# Patient Record
Sex: Male | Born: 1970 | Race: White | Hispanic: No | Marital: Married | Smoking: Never smoker
Health system: Southern US, Community
[De-identification: ages and names within clinical notes are randomized; demographics above are authoritative.]

## PROBLEM LIST (undated history)

## (undated) DIAGNOSIS — I712 Thoracic aortic aneurysm, without rupture: Principal | ICD-10-CM

## (undated) DIAGNOSIS — K4020 Bilateral inguinal hernia, without obstruction or gangrene, not specified as recurrent: Secondary | ICD-10-CM

## (undated) DIAGNOSIS — Z8619 Personal history of other infectious and parasitic diseases: Secondary | ICD-10-CM

## (undated) DIAGNOSIS — Z87898 Personal history of other specified conditions: Secondary | ICD-10-CM

## (undated) DIAGNOSIS — G4733 Obstructive sleep apnea (adult) (pediatric): Secondary | ICD-10-CM

## (undated) HISTORY — DX: Thoracic aortic aneurysm, without rupture: I71.2

---

## 1994-01-20 HISTORY — PX: ANTERIOR CRUCIATE LIGAMENT REPAIR: SHX115

## 1998-11-18 ENCOUNTER — Emergency Department (HOSPITAL_COMMUNITY): Admission: EM | Admit: 1998-11-18 | Discharge: 1998-11-18 | Payer: Self-pay | Admitting: Emergency Medicine

## 2004-11-21 ENCOUNTER — Encounter: Admission: RE | Admit: 2004-11-21 | Discharge: 2004-11-21 | Payer: Self-pay | Admitting: Family Medicine

## 2012-02-21 DIAGNOSIS — I7121 Aneurysm of the ascending aorta, without rupture: Secondary | ICD-10-CM

## 2012-02-21 DIAGNOSIS — I77810 Thoracic aortic ectasia: Secondary | ICD-10-CM | POA: Insufficient documentation

## 2012-02-21 DIAGNOSIS — I712 Thoracic aortic aneurysm, without rupture: Secondary | ICD-10-CM | POA: Insufficient documentation

## 2012-02-21 HISTORY — DX: Aneurysm of the ascending aorta, without rupture: I71.21

## 2012-02-21 HISTORY — DX: Thoracic aortic aneurysm, without rupture: I71.2

## 2012-03-06 ENCOUNTER — Encounter (HOSPITAL_COMMUNITY): Payer: Self-pay | Admitting: Emergency Medicine

## 2012-03-06 ENCOUNTER — Emergency Department (HOSPITAL_COMMUNITY): Payer: Self-pay

## 2012-03-06 ENCOUNTER — Observation Stay (HOSPITAL_COMMUNITY)
Admission: EM | Admit: 2012-03-06 | Discharge: 2012-03-07 | Disposition: A | Discharge status: Home / Self Care | Payer: Self-pay | Attending: Internal Medicine | Admitting: Internal Medicine

## 2012-03-06 DIAGNOSIS — R000 Tachycardia, unspecified: Secondary | ICD-10-CM

## 2012-03-06 DIAGNOSIS — J029 Acute pharyngitis, unspecified: Secondary | ICD-10-CM | POA: Insufficient documentation

## 2012-03-06 DIAGNOSIS — R0602 Shortness of breath: Secondary | ICD-10-CM | POA: Insufficient documentation

## 2012-03-06 DIAGNOSIS — R110 Nausea: Secondary | ICD-10-CM | POA: Insufficient documentation

## 2012-03-06 DIAGNOSIS — R6883 Chills (without fever): Secondary | ICD-10-CM | POA: Insufficient documentation

## 2012-03-06 DIAGNOSIS — R079 Chest pain, unspecified: Principal | ICD-10-CM

## 2012-03-06 DIAGNOSIS — Z8249 Family history of ischemic heart disease and other diseases of the circulatory system: Secondary | ICD-10-CM | POA: Insufficient documentation

## 2012-03-06 DIAGNOSIS — I77819 Aortic ectasia, unspecified site: Secondary | ICD-10-CM | POA: Insufficient documentation

## 2012-03-06 LAB — CBC WITH DIFFERENTIAL/PLATELET
Basophils Absolute: 0 10*3/uL (ref 0.0–0.1)
Basophils Relative: 0 % (ref 0–1)
Eosinophils Absolute: 0.1 10*3/uL (ref 0.0–0.7)
Eosinophils Relative: 1 % (ref 0–5)
HCT: 46.1 % (ref 39.0–52.0)
Hemoglobin: 16.9 g/dL (ref 13.0–17.0)
Lymphocytes Relative: 11 % — ABNORMAL LOW (ref 12–46)
Lymphs Abs: 1 10*3/uL (ref 0.7–4.0)
MCH: 31.3 pg (ref 26.0–34.0)
MCV: 85.4 fL (ref 78.0–100.0)
Monocytes Absolute: 0.5 10*3/uL (ref 0.1–1.0)
Monocytes Relative: 5 % (ref 3–12)
Neutro Abs: 7.8 10*3/uL — ABNORMAL HIGH (ref 1.7–7.7)
Neutrophils Relative %: 84 % — ABNORMAL HIGH (ref 43–77)
Platelets: 157 10*3/uL (ref 150–400)
RBC: 5.4 MIL/uL (ref 4.22–5.81)
RDW: 12.2 % (ref 11.5–15.5)

## 2012-03-06 LAB — COMPREHENSIVE METABOLIC PANEL
ALT: 28 U/L (ref 0–53)
AST: 22 U/L (ref 0–37)
Albumin: 4.5 g/dL (ref 3.5–5.2)
Alkaline Phosphatase: 69 U/L (ref 39–117)
CO2: 24 mEq/L (ref 19–32)
Calcium: 9 mg/dL (ref 8.4–10.5)
Creatinine, Ser: 0.92 mg/dL (ref 0.50–1.35)
GFR calc Af Amer: >90 mL/min (ref >90)
GFR calc non Af Amer: >90 mL/min (ref >90)
Glucose, Bld: 108 mg/dL — ABNORMAL HIGH (ref 70–99)
Potassium: 4.2 mEq/L (ref 3.5–5.1)
Sodium: 137 mEq/L (ref 135–145)
Total Protein: 7.4 g/dL (ref 6.0–8.3)

## 2012-03-06 LAB — HEMOGLOBIN A1C
Hgb A1c MFr Bld: 5.4 % (ref <5.7)
Mean Plasma Glucose: 108 mg/dL (ref <117)

## 2012-03-06 LAB — LIPID PANEL
Cholesterol: 179 mg/dL (ref 0–200)
HDL: 43 mg/dL (ref >39)
Total CHOL/HDL Ratio: 4.2 RATIO

## 2012-03-06 LAB — TROPONIN I: Troponin I: <0.3 ng/mL (ref <0.30)

## 2012-03-06 MED ORDER — MORPHINE SULFATE 4 MG/ML IJ SOLN
4.0000 mg | Freq: Once | INTRAMUSCULAR | Status: AC
  Administered 2012-03-06: 4 mg via INTRAVENOUS
  Filled 2012-03-06: qty 1

## 2012-03-06 MED ORDER — SODIUM CHLORIDE 0.9 % IV SOLN: INTRAVENOUS | Status: DC

## 2012-03-06 MED ORDER — SODIUM CHLORIDE 0.9 % IJ SOLN
3.0000 mL | Freq: Two times a day (BID) | INTRAMUSCULAR | Status: DC
  Administered 2012-03-06: 3 mL via INTRAVENOUS

## 2012-03-06 MED ORDER — SODIUM CHLORIDE 0.9 % IV SOLN: 250.0000 mL | INTRAVENOUS | Status: DC | PRN

## 2012-03-06 MED ORDER — SODIUM CHLORIDE 0.9 % IJ SOLN
3.0000 mL | Freq: Two times a day (BID) | INTRAMUSCULAR | Status: DC
  Administered 2012-03-06 – 2012-03-07 (×2): 3 mL via INTRAVENOUS

## 2012-03-06 MED ORDER — IOHEXOL 350 MG/ML SOLN
80.0000 mL | Freq: Once | INTRAVENOUS | Status: AC | PRN
  Administered 2012-03-06: 80 mL via INTRAVENOUS

## 2012-03-06 MED ORDER — ONDANSETRON HCL 4 MG/2ML IJ SOLN: 4.0000 mg | Freq: Four times a day (QID) | INTRAMUSCULAR | Status: DC | PRN

## 2012-03-06 MED ORDER — ENOXAPARIN SODIUM 40 MG/0.4ML ~~LOC~~ SOLN
40.0000 mg | SUBCUTANEOUS | Status: DC
  Administered 2012-03-06: 40 mg via SUBCUTANEOUS
  Filled 2012-03-06 (×3): qty 0.4

## 2012-03-06 MED ORDER — ONDANSETRON HCL 4 MG PO TABS
4.0000 mg | ORAL_TABLET | Freq: Four times a day (QID) | ORAL | Status: DC | PRN
  Administered 2012-03-06: 4 mg via ORAL
  Filled 2012-03-06: qty 1

## 2012-03-06 MED ORDER — SODIUM CHLORIDE 0.9 % IV BOLUS (SEPSIS)
1000.0000 mL | Freq: Once | INTRAVENOUS | Status: AC
  Administered 2012-03-06: 1000 mL via INTRAVENOUS

## 2012-03-06 MED ORDER — ASPIRIN EC 81 MG PO TBEC
81.0000 mg | DELAYED_RELEASE_TABLET | Freq: Every day | ORAL | Status: DC
  Administered 2012-03-06 – 2012-03-07 (×2): 81 mg via ORAL
  Filled 2012-03-06 (×2): qty 1

## 2012-03-06 MED ORDER — ONDANSETRON HCL 4 MG/2ML IJ SOLN: 4.0000 mg | Freq: Three times a day (TID) | INTRAMUSCULAR | Status: DC | PRN

## 2012-03-06 MED ORDER — ASPIRIN 81 MG PO CHEW
324.0000 mg | CHEWABLE_TABLET | Freq: Once | ORAL | Status: DC
  Filled 2012-03-06: qty 4

## 2012-03-06 MED ORDER — NITROGLYCERIN 0.4 MG SL SUBL: 0.4000 mg | SUBLINGUAL_TABLET | SUBLINGUAL | Status: DC | PRN

## 2012-03-06 MED ORDER — IOHEXOL 350 MG/ML SOLN
75.0000 mL | Freq: Once | INTRAVENOUS | Status: AC | PRN
  Administered 2012-03-06: 75 mL via INTRAVENOUS

## 2012-03-06 MED ORDER — ONDANSETRON HCL 4 MG/2ML IJ SOLN
4.0000 mg | Freq: Once | INTRAMUSCULAR | Status: AC
  Administered 2012-03-06: 4 mg via INTRAVENOUS
  Filled 2012-03-06: qty 2

## 2012-03-06 MED ORDER — MORPHINE SULFATE 2 MG/ML IJ SOLN: 2.0000 mg | INTRAMUSCULAR | Status: DC | PRN

## 2012-03-06 MED ORDER — SODIUM CHLORIDE 0.9 % IJ SOLN: 3.0000 mL | INTRAMUSCULAR | Status: DC | PRN

## 2012-03-06 MED ORDER — METOPROLOL TARTRATE 12.5 MG HALF TABLET
12.5000 mg | ORAL_TABLET | Freq: Two times a day (BID) | ORAL | Status: DC
  Administered 2012-03-06 – 2012-03-07 (×3): 12.5 mg via ORAL
  Filled 2012-03-06 (×5): qty 1

## 2012-03-06 NOTE — ED Notes (Signed)
Pt presents to Ed today with c/o of chest pain and shortness of breath and chills at 2 am this morning, sore throat yesterday.

## 2012-03-06 NOTE — ED Notes (Signed)
Patient transported to CT 

## 2012-03-06 NOTE — ED Provider Notes (Signed)
History     CSN: [id number]  Arrival date & time 03/06/12  0709   First MD Initiated Contact with Patient 03/06/12 0715      Chief Complaint  Patient presents with  . Chest Pain    (Consider location/radiation/quality/duration/timing/severity/associated sxs/prior treatment) HPI  History reviewed. No pertinent past medical history.  Past Surgical History  Procedure Laterality Date  . Knee surgery      History reviewed. No pertinent family history.  History  Substance Use Topics  . Smoking status: Not on file  . Smokeless tobacco: Not on file  . Alcohol Use: Not on file      Review of Systems  Allergies  Review of patient's allergies indicates no known allergies.  Home Medications  No current outpatient prescriptions on file.  BP 122/79  Pulse 104  Temp(Src) 98.2 F (36.8 C) (Oral)  Resp 19  Ht 6\' 1"  (1.854 m)  Wt 199 lb 15.3 oz (90.7 kg)  BMI 26.39 kg/m2  SpO2 96%  Physical Exam  ED Course  Procedures (including critical care time)  Labs Reviewed  CBC WITH DIFFERENTIAL - Abnormal; Notable for the following:    MCHC 36.7 (*)    Neutrophils Relative 84 (*)    Neutro Abs 7.8 (*)    Lymphocytes Relative 11 (*)    All other components within normal limits  COMPREHENSIVE METABOLIC PANEL - Abnormal; Notable for the following:    Glucose, Bld 108 (*)    All other components within normal limits  TROPONIN I  TSH  TROPONIN I  TROPONIN I  TROPONIN I  HEMOGLOBIN A1C  LIPID PANEL   Dg Chest 2 View  03/06/2012  *RADIOLOGY REPORT*  Clinical Data: Chest pain.  CHEST - 2 VIEW  Comparison: No priors.  Findings: Lung volumes are normal.  No consolidative airspace disease.  No pleural effusions.  No pneumothorax.  No pulmonary nodule or mass noted.  Pulmonary vasculature and the cardiomediastinal silhouette are within normal limits.  IMPRESSION: 1. No radiographic evidence of acute cardiopulmonary disease.   Original Report Authenticated By: Trudie Reed,  M.D.    Ct Angio Chest W/cm &/or Wo Cm  03/06/2012  *RADIOLOGY REPORT*  Clinical Data: Chest pain.  CT ANGIOGRAPHY CHEST  Technique:  Multidetector CT imaging of the chest using the standard protocol during bolus administration of intravenous contrast. Multiplanar reconstructed images including MIPs were obtained and reviewed to evaluate the vascular anatomy.  Contrast: 80mL OMNIPAQUE IOHEXOL 350 MG/ML SOLN, 75mL OMNIPAQUE IOHEXOL 350 MG/ML SOLN  Comparison: 03/06/2012  Findings: No filling defects in the pulmonary arteries to suggest pulmonary emboli.  Heart is upper limits normal in size.  Ascending aorta is slightly dilated measuring maximally 4.2 cm.  Descending thoracic aorta is normal caliber.  Small scattered mediastinal and bilateral axillary lymph nodes, none pathologically enlarged.  No adenopathy.  Lungs are clear.  No effusions. Visualized thyroid and chest wall soft tissues unremarkable. Imaging into the upper abdomen shows no acute findings.  And no acute bony abnormality.  IMPRESSION: No evidence of pulmonary embolus.  Slightly dilated ascending aorta, 4.2 cm proximally.   Original Report Authenticated By: Charlett Nose, M.D.      1. Chest pain   2. Tachycardia      Date: 03/06/2012  Rate: 111  Rhythm: sinus tachycardia  QRS Axis: normal  Intervals: normal  ST/T Wave abnormalities: normal  Conduction Disutrbances:none  Narrative Interpretation:   Old EKG Reviewed: none available    MDM  I personally performed  the services described in this documentation, which was scribed in my presence. The recorded information has been reviewed and is accurate.          [name], MD 03/06/12 1311

## 2012-03-06 NOTE — ED Notes (Signed)
Pt returned from CT; back on monitor; reports feeling better.

## 2012-03-06 NOTE — ED Notes (Signed)
PT reports he is feeling better since the morphine. Still c/o bilateral leg pain but reports it has improved.

## 2012-03-06 NOTE — H&P (Signed)
[facility] ADMISSION NOTE    Date: 03/06/2012               Patient Name:  Charles Skinner MRN: [id number]  DOB: [birth year] Age / Sex: 42 y.o., male   PCP: No primary provider on file.              Chief Complaint: chest pain and palpitations  History of Present Illness: Patient is a 43 y.o. male with no significant  PMHx , who presents to [facility] for evaluation of acute onset chest pain that began on the day of admission while the patient was asllep and woke him up at 2 AM. Pain is described as a dull chest pain located over left chest. The pain is rated as a 9/10 in severity at onset and currently rated 0/10. The pain is notably aggravated by nothing. It is alleviated by NSAIDs. has associated palpitations and shortness of breath. For the pain, the patient tried NSAIDs for pain, with minimal relief.  The patient has not experienced similar symptoms previously. Secondary to his constellation of symptoms, Mr. Charles Skinner presented to [facility] for further evaluation. Otherwise, the patient denies diaphoresis, nausea, near syncope and syncope.   Current Outpatient Medications:  (Not in a hospital admission)  Allergies: No Known Allergies   Past Medical History: History reviewed. No pertinent past medical history.  Past Surgical History: Past Surgical History  Procedure Laterality Date  . Knee surgery      Family History: History reviewed. No pertinent family history.  Social History: History   Social History  . Marital Status: Married    Spouse Name: N/A    Number of Children: N/A  . Years of Education: N/A   Occupational History  . Not on file.   Social History Main Topics  . Smoking status: Not on file  . Smokeless tobacco: Not on file  . Alcohol Use: Not on file  . Drug Use: Not on file  . Sexually Active: Not on file   Other Topics Concern  . Not on file   Social History Narrative  . No narrative on file    Review of Systems: Constitutional:  denies fever, ,  diaphoresis, appetite change and fatigue. Positive for chills  HEENT: denies photophobia, eye pain, redness, hearing loss, ear pain, congestion, , rhinorrhea, sneezing, neck pain, neck stiffness and tinnitus. Positive for sore throat  Respiratory: denies, DOE, cough, chest tightness, and wheezing. Admits to SOB  Cardiovascular: denies , palpitations and leg swelling. + chest pain  Gastrointestinal: admits to nausea, vomiting, . Denies abdominal pain, diarrhea, constipation, blood in stool.  Genitourinary: denies dysuria, urgency, frequency, hematuria, flank pain and difficulty urinating.  Musculoskeletal: denies  myalgias, back pain, joint swelling, arthralgias and gait problem.   Skin: denies pallor, rash and wound.  Neurological: denies dizziness, seizures, syncope, weakness, light-headedness, numbness and headaches.   Hematological: denies adenopathy, easy bruising, personal or family bleeding history.  Psychiatric/ Behavioral: denies suicidal ideation, mood changes, confusion, nervousness, sleep disturbance and agitation.    Vital Signs: Blood pressure 98/60, pulse 109, temperature 98.1 F (36.7 C), temperature source Oral, resp. rate 20, SpO2 97.00%.  Physical Exam: General: Vital signs reviewed and noted. Well-developed, well-nourished, in no acute distress; alert, appropriate and cooperative throughout examination.  Head: Normocephalic, atraumatic.  Eyes: PERRL, EOMI, No signs of anemia or jaundince.  Nose: Mucous membranes moist, not inflammed, nonerythematous.  Throat: Oropharynx nonerythematous, no exudate appreciated.   Neck: No deformities, masses, or tenderness  noted.Supple, No carotid Bruits, no JVD.  Lungs:  Normal respiratory effort. Clear to auscultation BL without crackles or wheezes.  Heart: Tachycardic. Regular rhythm. S1 and S2 normal without gallop, murmur, or rubs.  Abdomen:  BS normoactive. Soft, Nondistended, non-tender.  No masses or organomegaly.  Extremities:  No pretibial edema.  Neurologic: A&O X3, CN II - XII are grossly intact. Motor strength is 5/5 in the all 4 extremities, Sensations intact to light touch, Cerebellar signs negative.  Skin: No visible rashes, scars.    Lab results: Basic Metabolic Panel:  Recent Labs  45/40/98 0739  NA 137  K 4.2  CL 100  CO2 24  GLUCOSE 108*  BUN 22  CREATININE 0.92  CALCIUM 9.0   Liver Function Tests:  Recent Labs  03/06/12 0739  AST 22  ALT 28  ALKPHOS 69  BILITOT 0.6  PROT 7.4  ALBUMIN 4.5   CBC:  Recent Labs  03/06/12 0739  WBC 9.4  NEUTROABS 7.8*  HGB 16.9  HCT 46.1  MCV 85.4  PLT 157   Cardiac Enzymes:  Recent Labs  03/06/12 0740  TROPONINI <0.30       Imaging results:  Dg Chest 2 View  03/06/2012  *RADIOLOGY REPORT*  Clinical Data: Chest pain.  CHEST - 2 VIEW  Comparison: No priors.  Findings: Lung volumes are normal.  No consolidative airspace disease.  No pleural effusions.  No pneumothorax.  No pulmonary nodule or mass noted.  Pulmonary vasculature and the cardiomediastinal silhouette are within normal limits.  IMPRESSION: 1. No radiographic evidence of acute cardiopulmonary disease.   Original Report Authenticated By: Trudie Reed, M.D.    Ct Angio Chest W/cm &/or Wo Cm  03/06/2012  *RADIOLOGY REPORT*  Clinical Data: Chest pain.  CT ANGIOGRAPHY CHEST  Technique:  Multidetector CT imaging of the chest using the standard protocol during bolus administration of intravenous contrast. Multiplanar reconstructed images including MIPs were obtained and reviewed to evaluate the vascular anatomy.  Contrast: 80mL OMNIPAQUE IOHEXOL 350 MG/ML SOLN, 75mL OMNIPAQUE IOHEXOL 350 MG/ML SOLN  Comparison: 03/06/2012  Findings: No filling defects in the pulmonary arteries to suggest pulmonary emboli.  Heart is upper limits normal in size.  Ascending aorta is slightly dilated measuring maximally 4.2 cm.  Descending thoracic aorta is normal caliber.  Small scattered mediastinal and  bilateral axillary lymph nodes, none pathologically enlarged.  No adenopathy.  Lungs are clear.  No effusions. Visualized thyroid and chest wall soft tissues unremarkable. Imaging into the upper abdomen shows no acute findings.  And no acute bony abnormality.  IMPRESSION: No evidence of pulmonary embolus.  Slightly dilated ascending aorta, 4.2 cm proximally.   Original Report Authenticated By: Charlett Nose, M.D.       Other results:  EKG (03/06/2012) - sinus tachycardia, regular rate of approximately 120 bpm, normal axis, ST segments: normal.       Assessment & Plan: Pt is a 42 y.o. yo male with no significant PMHX of who presented to [facility] on 03/06/2012 with symptoms of left chest chest pain, with EKG showing sinus tachycardia with no acute ST- T wave changes and initial negative troponins. Therefore, interventions at this time will be focused on risk factor stratification.   Atypical Chest pain - patient does not have known history of coronary artery disease. He also has  No cardiac risk factors. Admission EKG showed sinus tachycardia , and cardiac enzymes have been negative so far.  We will also strict risk factor modification and management. CT angio  was done in ER which ruled out PE. Patient does have slightly dilated ascending aorta which will need future monitoring.   Admit to telemetry  Continue to cycle cardiac enzymes.  Continue aspirin  Check HbAIC and FLP for risk starification.  Continue morphine, oxygen, PRN nitroglycerin.  Start metoprolol 12.5 BID for tachycardia     DVT PPX - low molecular weight heparin  CODE STATUS - Full  COMMUNICATION: Patient and family at the bedside were updated on the plan of care.   DISPO - Disposition is deferred at this time, awaiting improvement of chest pain and tachycardia. Likely in 1-2 days     Signed: Lars Mage, MD  03/06/2012, 11:37 AM

## 2012-03-06 NOTE — ED Provider Notes (Signed)
History  This chart was scribed for Charles Racer, MD by Bennett Scrape, ED Scribe. This patient was seen in room A11C/A11C and the patient's care was started at 7:15 AM.  CSN: [id number]  Arrival date & time 03/06/12  0709   First MD Initiated Contact with Patient 03/06/12 0715      Chief Complaint  Patient presents with  . Chest Pain     Patient is a 42 y.o. male presenting with chest pain. The history is provided by the patient. No language interpreter was used.  Chest Pain Pain location:  L chest Pain radiates to:  Does not radiate Pain radiates to the back: no   Onset quality:  Sudden Duration:  5 hours Timing:  Constant Progression:  Improving Chronicity:  New Relieved by:  None tried Ineffective treatments:  None tried Associated symptoms: nausea and shortness of breath   Associated symptoms: no abdominal pain, no back pain, no cough, no diaphoresis, no fever and not vomiting   Risk factors: no prior DVT/PE and no surgery     Charles Skinner is a 42 y.o. male who presents to the Emergency Department complaining of 5 hours of sudden onset, gradually improving, constant CP with associated nausea, chills and SOB. He states that his pain and SOB are mild currently. He denies having prior episodes of similar symptoms. He denies any recent cough, travel, surgery. He denies having any sick contacts. He denies having a h/o DVTs. He reports a h/o MI in his family (Mother at age 41). He reports a mild sore throat yesterday denies back pain, rhinorrhea, sore throat, abdominal pain and fevers as associated symptoms.    History reviewed. No pertinent past medical history.  Past Surgical History  Procedure Laterality Date  . Knee surgery      History reviewed. No pertinent family history.  History  Substance Use Topics  . Smoking status: Not on file  . Smokeless tobacco: Not on file  . Alcohol Use: Not on file      Review of Systems  Constitutional: Positive for chills.  Negative for fever and diaphoresis.  HENT: Positive for sore throat. Negative for rhinorrhea.   Respiratory: Positive for shortness of breath. Negative for cough.   Cardiovascular: Positive for chest pain. Negative for leg swelling.  Gastrointestinal: Positive for nausea. Negative for vomiting and abdominal pain.  Musculoskeletal: Negative for back pain.  All other systems reviewed and are negative.    Allergies  Review of patient's allergies indicates no known allergies.  Home Medications   Current Outpatient Rx  Name  Route  Sig  Dispense  Refill  . ibuprofen (ADVIL,MOTRIN) 200 MG tablet   Oral   Take 200 mg by mouth every 6 (six) hours as needed for pain.           Triage Vitals: BP 122/84  Pulse 112  Temp(Src) 97.5 F (36.4 C) (Oral)  Resp 18  SpO2 96%  Physical Exam  Nursing note and vitals reviewed. Constitutional: He is oriented to person, place, and time. He appears well-developed and well-nourished. No distress.  HENT:  Head: Normocephalic and atraumatic.  Mouth/Throat: Oropharynx is clear and moist.  Eyes: Conjunctivae and EOM are normal. Pupils are equal, round, and reactive to light.  Neck: Normal range of motion. Neck supple. No tracheal deviation present.  Cardiovascular: Regular rhythm.  Tachycardia present.   No murmur heard. HR is 108  Pulmonary/Chest: Effort normal and breath sounds normal. No respiratory distress.  Abdominal: Soft. There  is no tenderness.  Musculoskeletal: Normal range of motion. He exhibits no edema.  Right calf appears to be larger than the left  Neurological: He is alert and oriented to person, place, and time.  Skin: Skin is warm and dry.  Psychiatric: He has a normal mood and affect. His behavior is normal.    ED Course  Procedures (including critical care time)  DIAGNOSTIC STUDIES: Oxygen Saturation is 96% on room air, adequate by my interpretation.    COORDINATION OF CARE: 7:30 AM-Discussed treatment plan which  includes ASA, CXR, CBC panel, and troponin with pt at bedside and pt agreed to plan.   7:45 AM-Ordered 324 mg ASA and 1,000 mL of bolus  8:30 AM- Ordered 4 mg morphine injection and 4 mg Zofran injection  10:42 AM-Pt rechecked and denies CP. Upon re-exam, pt is still tachycardic.  10:52 AM-Consult complete with Dr. Eben Burow, Hospitalist. Patient case explained and discussed. Dr. Eben Burow agrees to evaluate the pt in the ED for admission. Call ended at 10:53 AM.  Labs Reviewed  CBC WITH DIFFERENTIAL - Abnormal; Notable for the following:    MCHC 36.7 (*)    Neutrophils Relative 84 (*)    Neutro Abs 7.8 (*)    Lymphocytes Relative 11 (*)    All other components within normal limits  COMPREHENSIVE METABOLIC PANEL - Abnormal; Notable for the following:    Glucose, Bld 108 (*)    All other components within normal limits  TROPONIN I   Dg Chest 2 View  03/06/2012  *RADIOLOGY REPORT*  Clinical Data: Chest pain.  CHEST - 2 VIEW  Comparison: No priors.  Findings: Lung volumes are normal.  No consolidative airspace disease.  No pleural effusions.  No pneumothorax.  No pulmonary nodule or mass noted.  Pulmonary vasculature and the cardiomediastinal silhouette are within normal limits.  IMPRESSION: 1. No radiographic evidence of acute cardiopulmonary disease.   Original Report Authenticated By: Trudie Reed, M.D.    Ct Angio Chest W/cm &/or Wo Cm  03/06/2012  *RADIOLOGY REPORT*  Clinical Data: Chest pain.  CT ANGIOGRAPHY CHEST  Technique:  Multidetector CT imaging of the chest using the standard protocol during bolus administration of intravenous contrast. Multiplanar reconstructed images including MIPs were obtained and reviewed to evaluate the vascular anatomy.  Contrast: 80mL OMNIPAQUE IOHEXOL 350 MG/ML SOLN, 75mL OMNIPAQUE IOHEXOL 350 MG/ML SOLN  Comparison: 03/06/2012  Findings: No filling defects in the pulmonary arteries to suggest pulmonary emboli.  Heart is upper limits normal in size.  Ascending  aorta is slightly dilated measuring maximally 4.2 cm.  Descending thoracic aorta is normal caliber.  Small scattered mediastinal and bilateral axillary lymph nodes, none pathologically enlarged.  No adenopathy.  Lungs are clear.  No effusions. Visualized thyroid and chest wall soft tissues unremarkable. Imaging into the upper abdomen shows no acute findings.  And no acute bony abnormality.  IMPRESSION: No evidence of pulmonary embolus.  Slightly dilated ascending aorta, 4.2 cm proximally.   Original Report Authenticated By: Charlett Nose, M.D.      1. Chest pain   2. Tachycardia      Date: 03/06/2012  Rate: 111  Rhythm: sinus tachycardia  QRS Axis: normal  Intervals: normal  ST/T Wave abnormalities: normal  Conduction Disutrbances:none  Narrative Interpretation:   Old EKG Reviewed: none available    MDM   I personally performed the services described in this documentation, which was scribed in my presence. The recorded information has been reviewed and is accurate.  Charles Racer, MD 03/06/12 1056

## 2012-03-07 ENCOUNTER — Encounter (HOSPITAL_COMMUNITY): Payer: Self-pay | Admitting: *Deleted

## 2012-03-07 DIAGNOSIS — R079 Chest pain, unspecified: Principal | ICD-10-CM

## 2012-03-07 MED ORDER — INFLUENZA VIRUS VACC SPLIT PF IM SUSP: 0.5000 mL | INTRAMUSCULAR | Status: DC

## 2012-03-07 MED ORDER — METOPROLOL TARTRATE 12.5 MG HALF TABLET: 12.5000 mg | ORAL_TABLET | Freq: Two times a day (BID) | ORAL | Status: DC

## 2012-03-07 NOTE — Discharge Summary (Signed)
Physician Discharge Summary  Charles Skinner WUJ:[id number] DOB: 07-24-1970 DOA: 03/06/2012  PCP: No primary provider on file.  Admit date: 03/06/2012 Discharge date: 03/07/2012  Recommendations for Outpatient Follow-up:  1. Patient was d/c home . He has a PCP and states that he will schedule a follow up appointment himself in 1-2 weeks. 2. At his follow up appointment, we need to make sure that he has optimum BP control with dilated ascending aorta ( 4.2 cm) noted on his CT chest.   3. Make sure his tachycardia has resolved.   Follow-up Information   Follow up with Pcp Not In System. Schedule an appointment as soon as possible for a visit in 2 weeks. (Please follow up with your PCP in 2-4 weeks)    Contact information:   Please follow up with your PCP in 2-4 weeks     Discharge Diagnoses:  Chest pain Tachycardia Dilated Ascending Aorta to 4.2 cm proximally noted on CT chest   Discharge Condition: Stable. Disposition: Home  Consultants: None  Diet recommendation: Regular  Filed Weights   03/06/12 1200 03/07/12 0602  Weight: 199 lb 15.3 oz (90.7 kg) 174 lb 2.6 oz (79 kg)    History of present illness: Patient is a 42 y.o. male with no significant PMHx , who presents to [facility] for evaluation of acute onset chest pain that began on the day of admission while the patient was asllep and woke him up at 2 AM. Pain is described as a dull chest pain located over left chest. The pain is rated as a 9/10 in severity at onset and currently rated 0/10. The pain is notably aggravated by nothing. It is alleviated by NSAIDs. has associated palpitations and shortness of breath. For the pain, the patient tried NSAIDs for pain, with minimal relief.  The patient has not experienced similar symptoms previously. Secondary to his constellation of symptoms, [name] presented to [facility] for further evaluation. Otherwise, the patient denies diaphoresis, nausea, near syncope and syncope.    Hospital Course:  1.  Chest pain and tachycardia: Patient presented with sudden onset left sided chest pain that woke him  Up from sleep and tachycardia. He underwent CT- angio that was negative for PE. Incidentally he was found to have mildly dilated ascending thoracic aorta to 4.2 cm. He was started on metoprolol that helped with his tachycardia. Marland Kitchen His cardiac work up including Troponin's x3 were negative. EKG did not show any acute ST- T wave changes. His TSH, Hb AIC was WNL. His symptoms were attributed to mildly dilated ascending aorta. He was advised to schedule outpatient follow up with his PCP in 1-2 weeks. His chest pain continued to improve overnight- was chest pain free at d/c.    Discharge Instructions      Discharge Orders   Future Orders Complete By Expires     Call MD for:  As directed     Comments:      Please follow up with your PCP if you feel palpitations, chest pain, lightheadedness or dizziness.    Diet - low sodium heart healthy  As directed     Discharge instructions  As directed     Comments:      Patient had sinus tachycardia  And ascending aorta dilatation which needs to be followed as outpatient.    Increase activity slowly  As directed     Other Restrictions  As directed     Comments:      Please avoid  caffienated products. Please follow up with your PCP regularly.        Medication List    STOP taking these medications       ibuprofen 200 MG tablet  Commonly known as:  ADVIL,MOTRIN      TAKE these medications       metoprolol tartrate 12.5 mg Tabs  Commonly known as:  LOPRESSOR  Take 0.5 tablets (12.5 mg total) by mouth 2 (two) times daily.       Follow-up Information   Follow up with Pcp Not In System. Schedule an appointment as soon as possible for a visit in 2 weeks. (Please follow up with your PCP in 2-4 weeks)    Contact information:   Please follow up with your PCP in 2-4 weeks       The results of significant diagnostics from this hospitalization  (including imaging, microbiology, ancillary and laboratory) are listed below for reference.    Significant Diagnostic Studies: Dg Chest 2 View  03/06/2012  *RADIOLOGY REPORT*  Clinical Data: Chest pain.  CHEST - 2 VIEW  Comparison: No priors.  Findings: Lung volumes are normal.  No consolidative airspace disease.  No pleural effusions.  No pneumothorax.  No pulmonary nodule or mass noted.  Pulmonary vasculature and the cardiomediastinal silhouette are within normal limits.  IMPRESSION: 1. No radiographic evidence of acute cardiopulmonary disease.   Original Report Authenticated By: Trudie Reed, M.D.    Ct Angio Chest W/cm &/or Wo Cm  03/06/2012  *RADIOLOGY REPORT*  Clinical Data: Chest pain.  CT ANGIOGRAPHY CHEST  Technique:  Multidetector CT imaging of the chest using the standard protocol during bolus administration of intravenous contrast. Multiplanar reconstructed images including MIPs were obtained and reviewed to evaluate the vascular anatomy.  Contrast: 80mL OMNIPAQUE IOHEXOL 350 MG/ML SOLN, 75mL OMNIPAQUE IOHEXOL 350 MG/ML SOLN  Comparison: 03/06/2012  Findings: No filling defects in the pulmonary arteries to suggest pulmonary emboli.  Heart is upper limits normal in size.  Ascending aorta is slightly dilated measuring maximally 4.2 cm.  Descending thoracic aorta is normal caliber.  Small scattered mediastinal and bilateral axillary lymph nodes, none pathologically enlarged.  No adenopathy.  Lungs are clear.  No effusions. Visualized thyroid and chest wall soft tissues unremarkable. Imaging into the upper abdomen shows no acute findings.  And no acute bony abnormality.  IMPRESSION: No evidence of pulmonary embolus.  Slightly dilated ascending aorta, 4.2 cm proximally.   Original Report Authenticated By: Charlett Nose, M.D.     Microbiology: No results found for this or any previous visit (from the past 240 hour(s)).   Labs: Basic Metabolic Panel:  Recent Labs Lab 03/06/12 0739  NA 137   K 4.2  CL 100  CO2 24  GLUCOSE 108*  BUN 22  CREATININE 0.92  CALCIUM 9.0   Liver Function Tests:  Recent Labs Lab 03/06/12 0739  AST 22  ALT 28  ALKPHOS 69  BILITOT 0.6  PROT 7.4  ALBUMIN 4.5   CBC:  Recent Labs Lab 03/06/12 0739  WBC 9.4  NEUTROABS 7.8*  HGB 16.9  HCT 46.1  MCV 85.4  PLT 157   Cardiac Enzymes:  Recent Labs Lab 03/06/12 0740 03/06/12 1235 03/06/12 1811 03/07/12 0034  TROPONINI <0.30 <0.30 <0.30 <0.30    Active Problems: Chest pain Tachycardia Mildly dilated ascending aorta  Time coordinating discharge: 40 minutes  Signed:  Lars Mage, MD [facility] 03/07/2012, 11:58 AM

## 2012-03-07 NOTE — Progress Notes (Signed)
Utilization review completed.  

## 2012-08-14 ENCOUNTER — Emergency Department (HOSPITAL_COMMUNITY): Payer: Self-pay

## 2012-08-14 ENCOUNTER — Encounter (HOSPITAL_COMMUNITY): Payer: Self-pay | Admitting: Emergency Medicine

## 2012-08-14 ENCOUNTER — Emergency Department (HOSPITAL_COMMUNITY)
Admission: EM | Admit: 2012-08-14 | Discharge: 2012-08-14 | Disposition: A | Discharge status: Home / Self Care | Payer: Self-pay | Attending: Emergency Medicine | Admitting: Emergency Medicine

## 2012-08-14 DIAGNOSIS — Z79899 Other long term (current) drug therapy: Secondary | ICD-10-CM | POA: Insufficient documentation

## 2012-08-14 DIAGNOSIS — R1084 Generalized abdominal pain: Secondary | ICD-10-CM | POA: Insufficient documentation

## 2012-08-14 DIAGNOSIS — Z8679 Personal history of other diseases of the circulatory system: Secondary | ICD-10-CM | POA: Insufficient documentation

## 2012-08-14 DIAGNOSIS — R198 Other specified symptoms and signs involving the digestive system and abdomen: Secondary | ICD-10-CM | POA: Insufficient documentation

## 2012-08-14 LAB — COMPREHENSIVE METABOLIC PANEL
CO2: 29 mEq/L (ref 19–32)
Calcium: 9.5 mg/dL (ref 8.4–10.5)
Creatinine, Ser: 0.99 mg/dL (ref 0.50–1.35)
GFR calc Af Amer: >90 mL/min (ref >90)
GFR calc non Af Amer: >90 mL/min (ref >90)
Glucose, Bld: 95 mg/dL (ref 70–99)
Total Protein: 7 g/dL (ref 6.0–8.3)

## 2012-08-14 LAB — CBC WITH DIFFERENTIAL/PLATELET
Eosinophils Absolute: 0.1 10*3/uL (ref 0.0–0.7)
Eosinophils Relative: 1 % (ref 0–5)
HCT: 42.4 % (ref 39.0–52.0)
Lymphocytes Relative: 34 % (ref 12–46)
Lymphs Abs: 2.6 10*3/uL (ref 0.7–4.0)
MCH: 31.3 pg (ref 26.0–34.0)
MCV: 85.5 fL (ref 78.0–100.0)
Monocytes Absolute: 0.5 10*3/uL (ref 0.1–1.0)
RDW: 12.1 % (ref 11.5–15.5)
WBC: 7.5 10*3/uL (ref 4.0–10.5)

## 2012-08-14 MED ORDER — LACTINEX PO CHEW: 1.0000 | CHEWABLE_TABLET | Freq: Three times a day (TID) | ORAL | Status: DC

## 2012-08-14 NOTE — ED Provider Notes (Signed)
CSN: [id number]     Arrival date & time 08/14/12  1704 History     First MD Initiated Contact with Patient 08/14/12 1756     Chief Complaint  Patient presents with  . Abdominal Pain  . Constipation   (Consider location/radiation/quality/duration/timing/severity/associated sxs/prior Treatment) HPI  Charles Skinner is a(n) 42 y.o. male who presents with chief complaint of 2 weeks of constipation.  Patient states that 4 weeks ago he began having bowel irregularity.  He states that he was having small bowel movements that were irregular.  He states that this is very abnormal for his previous history as he usually has 3 bowel movements daily.  2 weeks ago he saw his primary care physician who did lab work and a rectal exam which were normal.  She asked him to begin taking Metamucil and increasing his BP vegetables, water intake.  Patient has been doing so.  He states he also had Dulcolax about 7 days ago and made a bowel movement which was a very large.  Days ago the patient drank a bottle and a half of magnesium citrate proceeded to have voluminous liquid stool.  The patient has not had a full pelvic bowel movements it and came to the emergency department for evaluation.  He states that he has a followup appointment with GI on August 6 but "could not wait that long."  He has some associated abdominal discomfort and mild nausea but he denies any mucus or blood in his stools he denies hard stool balls he denies hematochezia or melena.  He has no family history of gastric or colon cancer. Denies fevers, chills, myalgias, arthralgias. Denies DOE, SOB, chest tightness or pressure, radiation to left arm, jaw or back, or diaphoresis. Denies dysuria, flank pain, suprapubic pain, frequency, urgency, or hematuria. Denies headaches, light headedness, weakness, visual disturbances.    Past Medical History  Diagnosis Date  . Sinus tachycardia    Past Surgical History  Procedure Laterality Date  . Knee surgery      No family history on file. History  Substance Use Topics  . Smoking status: Never Smoker   . Smokeless tobacco: Never Used  . Alcohol Use: No    Review of Systems Ten systems reviewed and are negative for acute change, except as noted in the HPI.   Allergies  Review of patient's allergies indicates no known allergies.  Home Medications   Current Outpatient Rx  Name  Route  Sig  Dispense  Refill  . ALOE VERA JUICE PO   Oral   Take 1 Bottle by mouth daily as needed (constipation).         . bisacodyl (BISACODYL) 5 MG EC tablet   Oral   Take 5 mg by mouth daily as needed for constipation.         Marland Kitchen ibuprofen (ADVIL,MOTRIN) 200 MG tablet   Oral   Take 400 mg by mouth every 6 (six) hours as needed for pain.         . magnesium citrate SOLN   Oral   Take 1 Bottle by mouth as needed (constipation).         . metoprolol tartrate (LOPRESSOR) 12.5 mg TABS   Oral   Take 12.5 mg by mouth every evening.          . psyllium (METAMUCIL) 58.6 % powder   Oral   Take 1 packet by mouth 2 (two) times daily.         Marland Kitchen lactobacillus  acidophilus & bulgar (LACTINEX) chewable tablet   Oral   Chew 1 tablet by mouth 3 (three) times daily with meals.   90 tablet   0    BP 130/78  Pulse 91  Temp(Src) 98 F (36.7 C) (Oral)  Resp 18  Ht 6\' 1"  (1.854 m)  Wt 199 lb (90.266 kg)  BMI 26.26 kg/m2  SpO2 97% Physical Exam Physical Exam  Nursing note and vitals reviewed. Constitutional: He appears well-developed and well-nourished. No distress.  HENT:  Head: Normocephalic and atraumatic.  Eyes: Conjunctivae normal are normal. No scleral icterus.  Neck: Normal range of motion. Neck supple.  Cardiovascular: Normal rate, regular rhythm and normal heart sounds.   Pulmonary/Chest: Effort normal and breath sounds normal. No respiratory distress.  Abdominal: Soft. There is no tenderness.  Hyperactive bowel sounds and borborygmi.  No masses no distention.  Musculoskeletal: He  exhibits no edema.  Neurological: He is alert.  Skin: Skin is warm and dry. He is not diaphoretic.  Psychiatric: His behavior is normal.    ED Course   Procedures (including critical care time)  Labs Reviewed  CBC WITH DIFFERENTIAL - Abnormal; Notable for the following:    MCHC 36.6 (*)    All other components within normal limits  COMPREHENSIVE METABOLIC PANEL   Dg Abd Acute W/chest  08/14/2012   *RADIOLOGY REPORT*  Clinical Data: Right lower quadrant abdominal pain.  Nausea. Diarrhea.  ACUTE ABDOMEN SERIES (ABDOMEN 2 VIEW & CHEST 1 VIEW)  Comparison: Chest x-ray 03/06/2012.  Findings: Lung volumes are normal.  No consolidative airspace disease.  No pleural effusions.  No pneumothorax.  No pulmonary nodule or mass noted.  Pulmonary vasculature and the cardiomediastinal silhouette are within normal limits.  Gas and stool are seen scattered throughout the colon extending to the level of the distal rectum.  No pathologic distension of small bowel is noted.  No gross evidence of pneumoperitoneum.  IMPRESSION: 1.  Nonobstructive bowel gas pattern. 2.  No pneumoperitoneum. 3.  No radiographic evidence of acute cardiopulmonary disease.   Original Report Authenticated By: Trudie Reed, M.D.   1. Generalized abdominal discomfort   2. Irregular bowel habits     MDM  7:29 PM BP 130/78  Pulse 91  Temp(Src) 98 F (36.7 C) (Oral)  Resp 18  Ht 6\' 1"  (1.854 m)  Wt 199 lb (90.266 kg)  BMI 26.26 kg/m2  SpO2 97% Patient without lab abnormalities.  Acute abdominal series shows no obstructions.  Appears to have a large amount of gas in the colon.  Believe that the patient cleared the majority of his: Using the bottle one half of magnesium site.  2 days ago and has not had enough stool volume to create a normal bowel movement yet.  Discharging the patient with antibiotics.  I have suggested he discontinue using Metamucil but continue with insulin fibers in water such as green leafy vegetables plenty  of fruits. The patient followup with the GI specialist on August 6.  I have discussed reasons to seek immediate care at the emergency department.The patient appears reasonably screened and/or stabilized for discharge and I doubt any other medical condition or other [facility] requiring further screening, evaluation, or treatment in the ED at this time prior to discharge.   Arthor Captain, PA-C 08/14/12 1930

## 2012-08-14 NOTE — ED Notes (Signed)
Urine collected at bedside.

## 2012-08-14 NOTE — ED Notes (Signed)
Pt reports approximately 3 weeks of constipation. Pt has been seen by PMD for same at onset and was told possibly due to diet and not enough fluids. Pt reports very small BM today. Pt c/o abdominal pain intermittently.

## 2012-08-15 NOTE — ED Provider Notes (Signed)
Medical screening examination/treatment/procedure(s) were performed by non-physician practitioner and as supervising physician I was immediately available for consultation/collaboration.  Donnetta Hutching, MD 08/15/12 2103

## 2013-03-24 ENCOUNTER — Telehealth: Payer: Self-pay | Admitting: *Deleted

## 2013-04-20 ENCOUNTER — Ambulatory Visit: Payer: Self-pay | Admitting: Cardiology

## 2013-05-23 NOTE — Telephone Encounter (Signed)
Encounter Closed---05/23/13 TP 

## 2013-05-24 ENCOUNTER — Encounter: Payer: Self-pay | Admitting: *Deleted

## 2013-05-26 ENCOUNTER — Ambulatory Visit (INDEPENDENT_AMBULATORY_CARE_PROVIDER_SITE_OTHER): Payer: BC Managed Care – PPO | Admitting: Cardiology

## 2013-05-26 VITALS — BP 106/82 | HR 71 | Ht 73.0 in | Wt 201.9 lb

## 2013-05-26 DIAGNOSIS — R5383 Other fatigue: Secondary | ICD-10-CM

## 2013-05-26 DIAGNOSIS — R002 Palpitations: Secondary | ICD-10-CM

## 2013-05-26 DIAGNOSIS — I7121 Aneurysm of the ascending aorta, without rupture: Secondary | ICD-10-CM

## 2013-05-26 DIAGNOSIS — R5381 Other malaise: Secondary | ICD-10-CM

## 2013-05-26 DIAGNOSIS — I712 Thoracic aortic aneurysm, without rupture, unspecified: Secondary | ICD-10-CM

## 2013-05-26 NOTE — Patient Instructions (Addendum)
Will schedule a CT -CHEST AORTOGRAM in Feb/MARCH 2016 ( ORDER WILL BE PLACED IN LATE JAN 2016) OFFICE WILL CONTACT YOU.  Your physician wants you to follow-up in March 2016 after test. You will receive a reminder letter in the mail two months in advance. If you don't receive a letter, please call our office to schedule the follow-up appointment.

## 2013-05-28 ENCOUNTER — Encounter: Payer: Self-pay | Admitting: Cardiology

## 2013-05-28 DIAGNOSIS — R5383 Other fatigue: Secondary | ICD-10-CM | POA: Insufficient documentation

## 2013-05-28 DIAGNOSIS — R002 Palpitations: Secondary | ICD-10-CM | POA: Insufficient documentation

## 2013-05-28 NOTE — Assessment & Plan Note (Addendum)
Am not sure this is because he is working so hard, or if his underlying physiologic reason. Unlikely to be of cardiac etiology. Is going to be evaluated again by PCP.

## 2013-05-28 NOTE — Assessment & Plan Note (Signed)
Much better since he started limiting his caffeine intake. This may be one reason why he is tired, but I'm reluctant to switch the alternatives of beta blocker for palpitations for fear of worsening his fatigue.

## 2013-05-28 NOTE — Assessment & Plan Note (Addendum)
Is any worsening problems. We'll send a followup every couple years. We will recheck a CT scan next year to ensure that is not growing. Continue cardiac risk factor modification. He is not hypertensive, does not smoke, lipids are at goal otherwise for age.

## 2013-05-28 NOTE — Progress Notes (Signed)
PATIENT: Charles SprangJeff W Hebenstreit MRN: [id number]  DOB: Jan 18, 1971   DOV:05/28/2013 PCP: No PCP Per Patient  Clinic Note: Chief Complaint  Patient presents with  . Follow-up    yearly follow-up, pt c/o of tiredness, pt haven't taken Metoprolol since august of last year   HPI: Charles SprangJeff W Pola is a 43 y.o.  male with a PMH below who presents today for annual followup. I last saw him in April last year. He is now much a significant medical history besides an acute episode chest discomfort evaluated last year. This is thought to be most likely related to muscular skeletal discomfort. However a CT scan of the chest to rule out PE demonstrated a slightly dilated ascending aorta 4.2 cm. He never started Lopressor that was initially planned for palpitations.  Interval History: Today he comes mostly with a complaint of feeling just tired and fatigued all the time. He apparently was evaluated again for the discomfort he had been found to have H. pylori gastritis and was treated with the antibiotic course. He still has some trouble with his diet. But for most part from a cardiac standpoint really no exertional or resting chest pain. His palpitations are notably improved. His biggest concern is the fact he falls asleep very quickly and is very tired that courses day. No chest pain or shortness of breath with rest or exertion.  No PND, orthopnea or edema.  No lightheadedness, dizziness, weakness or syncope/near syncope.  Only rare palpitations since decreasing his caffeine intake No TIA/amaurosis fugax symptoms. No melena, hematochezia, hematuria, or epistaxis.  Past Medical History  Diagnosis Date  . Sinus tachycardia   . Ascending aortic aneurysm February 2014    4.2 cm by CT scan  . Intermittent palpitations   . Helicobacter pylori (H. pylori) infection      status post antibiotic and PPI treatment     Prior Cardiac Evaluation and Past Surgical History: Past Surgical History  Procedure Laterality Date  . Knee  surgery  1996    ACL - left knee    No Known Allergies  Current Outpatient Prescriptions  Medication Sig Dispense Refill  . Cholecalciferol (VITAMIN D) 2000 UNITS CAPS Take 1 capsule by mouth daily.      . Flaxseed, Linseed, (FLAXSEED OIL) 1000 MG CAPS Take 1 capsule by mouth daily.      . vitamin C (ASCORBIC ACID) 500 MG tablet Take 500 mg by mouth daily.      . psyllium (METAMUCIL) 58.6 % powder Take 1 packet by mouth 2 (two) times daily.       No current facility-administered medications for this visit.    History   Social History Narrative   Married father of 2. 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   ROS: A comprehensive Review of Systems - Negative except Noncardiac symptoms noted below General ROS: positive for  - Excessive sleepiness and fatigue Gastrointestinal ROS: positive for - appetite loss, gas/bloating and heartburn  PHYSICAL EXAM BP 106/82  Pulse 71  Ht 6\' 1"  (1.854 m)  Wt 201 lb 14.4 oz (91.581 kg)  BMI 26.64 kg/m2 General appearance: alert, cooperative, appears stated age, no distress and Well-nourished, well-groomed Neck: no adenopathy, no carotid bruit, no JVD and supple, symmetrical, trachea midline Lungs: clear to auscultation bilaterally, normal percussion bilaterally and Nonlabored, good air movement Heart: regular rate and rhythm, S1, S2 normal, no murmur, click, rub or gallop Abdomen: soft, non-tender;  bowel sounds normal; no masses,  no organomegaly Extremities: extremities normal, atraumatic, no cyanosis or edema Pulses: 2+ and symmetric Neurologic: Alert and oriented X 3, normal strength and tone. Normal symmetric reflexes. Normal coordination and gait   Adult ECG Report  Rate: 71 ;  Rhythm: normal sinus rhythm, normal axis and intervals  Narrative Interpretation: Normal EKG  Recent Labs: None since last year  ASSESSMENT / PLAN: Ascending aortic aneurysm - 4.2 cm Is any worsening  problems. We'll send a followup every couple years. We will recheck a CT scan next year to ensure that is not growing. Continue cardiac risk factor modification. He is not hypertensive, does not smoke, lipids are at goal otherwise for age.  Intermittent palpitations Much better since he started limiting his caffeine intake. This may be one reason why he is tired, but I'm reluctant to switch the alternatives of beta blocker for palpitations for fear of worsening his fatigue.  Fatigue Am not sure this is because he is working so hard, or if his underlying physiologic reason. Unlikely to be of cardiac etiology. Is going to be evaluated again by PCP.    Orders Placed This Encounter  Procedures  . EKG 12-Lead     Followup: One year  DAVID W. Herbie BaltimoreHARDING, M.D., M.S. Interventional Cardiology [facility]

## 2013-08-29 ENCOUNTER — Other Ambulatory Visit: Payer: Self-pay | Admitting: Gastroenterology

## 2013-08-29 DIAGNOSIS — R112 Nausea with vomiting, unspecified: Secondary | ICD-10-CM

## 2013-09-05 ENCOUNTER — Ambulatory Visit
Admission: RE | Admit: 2013-09-05 | Discharge: 2013-09-05 | Disposition: A | Discharge status: Home / Self Care | Payer: BC Managed Care – PPO | Source: Ambulatory Visit | Attending: Gastroenterology | Admitting: Gastroenterology

## 2013-09-05 ENCOUNTER — Encounter (INDEPENDENT_AMBULATORY_CARE_PROVIDER_SITE_OTHER): Payer: Self-pay

## 2013-09-05 DIAGNOSIS — R112 Nausea with vomiting, unspecified: Secondary | ICD-10-CM

## 2014-03-01 ENCOUNTER — Telehealth: Payer: Self-pay | Admitting: *Deleted

## 2014-03-01 DIAGNOSIS — Z01812 Encounter for preprocedural laboratory examination: Secondary | ICD-10-CM

## 2014-03-01 DIAGNOSIS — I7121 Aneurysm of the ascending aorta, without rupture: Secondary | ICD-10-CM

## 2014-03-01 DIAGNOSIS — I712 Thoracic aortic aneurysm, without rupture: Secondary | ICD-10-CM

## 2014-03-01 NOTE — Telephone Encounter (Signed)
PATIENT RETURN CALL INFORMED PATIENT OF CT SCAN TO BE ORDERED. HE IS IN AGREEMENT WOULD PREFER IT TO BE SCHEDULE ON A Thursday or Friday Will need bun creat. Order - aware to have it done 3 days prior

## 2014-03-01 NOTE — Telephone Encounter (Signed)
LEFT MESSAGE TO CALL BACK DISCUSS ORDERING CT ANGIO CHEST FOR FOLLOW UP OF PREVIOUS CT SCAN TO MONITOR ASCENDING AORTA  AREA  4.2 CM. WILL NEED LABS

## 2014-03-01 NOTE — Telephone Encounter (Signed)
-----   Message from Tobin ChadSharon V. Reighan Hipolito, RN sent at 05/26/2013  5:58 PM EDT ----- Schedule  In feb /march 2016 CT CHEST  AORTOGRAM -- F/U ASCENDING AORTIC ANEURYSM  CALL PATIENT

## 2014-03-09 ENCOUNTER — Ambulatory Visit
Admission: RE | Admit: 2014-03-09 | Discharge: 2014-03-09 | Disposition: A | Discharge status: Home / Self Care | Payer: BLUE CROSS/BLUE SHIELD | Source: Ambulatory Visit | Attending: Cardiology | Admitting: Cardiology

## 2014-03-09 DIAGNOSIS — I712 Thoracic aortic aneurysm, without rupture: Secondary | ICD-10-CM

## 2014-03-09 DIAGNOSIS — I7121 Aneurysm of the ascending aorta, without rupture: Secondary | ICD-10-CM

## 2014-03-09 MED ORDER — IOHEXOL 350 MG/ML SOLN
75.0000 mL | Freq: Once | INTRAVENOUS | Status: AC | PRN
  Administered 2014-03-09: 75 mL via INTRAVENOUS

## 2014-03-10 ENCOUNTER — Telehealth: Payer: Self-pay | Admitting: *Deleted

## 2014-03-10 NOTE — Telephone Encounter (Signed)
-----   Message from Marykay Lexavid W Harding, MD sent at 03/10/2014  2:55 PM EST ----- Regarding: CT A Result Accidentally closed the result window -   Stable TAA size from prior documentation => probably recheck in 1 yr.  Marykay LexHARDING, DAVID W, MD

## 2014-03-10 NOTE — Telephone Encounter (Signed)
Spoke to patient. Result given . Verbalized understanding annual office appointment schedule for 03/29/14

## 2014-03-29 ENCOUNTER — Ambulatory Visit (INDEPENDENT_AMBULATORY_CARE_PROVIDER_SITE_OTHER): Payer: BLUE CROSS/BLUE SHIELD | Admitting: Cardiology

## 2014-03-29 ENCOUNTER — Encounter: Payer: Self-pay | Admitting: Cardiology

## 2014-03-29 VITALS — BP 110/80 | HR 84 | Ht 73.0 in | Wt 207.9 lb

## 2014-03-29 DIAGNOSIS — I712 Thoracic aortic aneurysm, without rupture: Secondary | ICD-10-CM

## 2014-03-29 DIAGNOSIS — R002 Palpitations: Secondary | ICD-10-CM

## 2014-03-29 DIAGNOSIS — I7121 Aneurysm of the ascending aorta, without rupture: Secondary | ICD-10-CM

## 2014-03-29 NOTE — Patient Instructions (Addendum)
Recheck Ascending  Aorta aneurysm next  FEB 2017  Your physician wants you to follow-up in 12 month Dr Herbie BaltimoreHARDING after CT SCAN  You will receive a reminder letter in the mail two months in advance. If you don't receive a letter, please call our office to schedule the follow-up appointment.

## 2014-03-31 ENCOUNTER — Encounter: Payer: Self-pay | Admitting: Cardiology

## 2014-03-31 NOTE — Assessment & Plan Note (Signed)
No longer an issue. Continue to avoid XS caffeine.

## 2014-03-31 NOTE — Assessment & Plan Note (Signed)
Stable ascending TAA - will follow one more annual evaluation -- if stable, the would only follow every 2-3 years. Maintain stable BP control -- he is well within normal limits; Lipids were at goal - continue to monitor.

## 2014-03-31 NOTE — Progress Notes (Signed)
PATIENT: Charles Skinner MRN: [id number]  DOB: Mar 26, 1970   DOV:03/31/2014 PCP: Farris Has, MD  Clinic Note: Chief Complaint  Patient presents with  . Annual Exam    RESULT OF CT SCAN , LAST VISIT OF2014, NO CHEST PAIN , NO SOB , NOEDEMA   HPI: Charles Skinner is a 44 y.o.  male with a PMH below who presents today for annual followup. I last saw him in May of 2015.  He does not have much a significant medical history besides an acute episode chest discomfort evaluated with a PE protocol CTA that demonstrated a slightly dilated ascending aorta 4.2 cm. He never started Lopressor that was initially planned for palpitations & has not had any further palpitations.  Interval History:  He is her e today simply to f/u his annual CT scan that showed stable ~4.1 cm diameter ascending aorta. He is completely asymptomatic from a  CV standpoint:   No chest pain or shortness of breath with rest or exertion.   No PND, orthopnea or edema.   No lightheadedness, dizziness, weakness or syncope/near syncope.   Miniaml palpitations since decreasing his caffeine intake  No TIA/amaurosis fugax symptoms.  No melena, hematochezia, hematuria, or epistaxis.  Past Medical History  Diagnosis Date  . Sinus tachycardia   . Ascending aortic aneurysm February 2014    4.2 cm by CT scan  . Intermittent palpitations   . Helicobacter pylori (H. pylori) infection      status post antibiotic and PPI treatment    CTA 03/09/2014: Stable aneurysmal disease of Thoracic Aorta (TA):   3.8 cm @ level of sinuses of Valsalva  4.1 cm in Ascending TA = greatest diameter  Proximal arch 2.1 & distal arch 2.1  Descending TA: 2.0 cm  No dissection & normal branch patency  Prior Cardiac Evaluation and Past Surgical History: Past Surgical History  Procedure Laterality Date  . Knee surgery  1996    ACL - left knee    No Known Allergies  Current Outpatient Prescriptions  Medication Sig Dispense Refill  . Cholecalciferol  (VITAMIN D) 2000 UNITS CAPS Take 1 capsule by mouth daily.    . Flaxseed, Linseed, (FLAXSEED OIL) 1000 MG CAPS Take 1 capsule by mouth daily.    . vitamin C (ASCORBIC ACID) 500 MG tablet Take 500 mg by mouth daily.     No current facility-administered medications for this visit.    History   Social History Narrative   Married father of 2. 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   ROS: A comprehensive Review of Systems - and was negative   PHYSICAL EXAM BP 110/80 mmHg  Pulse 84  Ht  (1.854 m)  Wt 207 lb 14.4 oz (94.303 kg)  BMI 27.44 kg/m2 General appearance: alert, cooperative, appears stated age, no distress and Well-nourished, well-groomed Neck: no adenopathy, no carotid bruit, no JVD and supple, symmetrical, trachea midline Lungs: clear to auscultation bilaterally, normal percussion bilaterally and Nonlabored, good air movement Heart: regular rate and rhythm, S1, S2 normal, no murmur, click, rub or gallop Abdomen: soft, non-tender; bowel sounds normal; no masses,  no organomegaly Extremities: extremities normal, atraumatic, no cyanosis or edema Pulses: 2+ and symmetric Neurologic: Alert and oriented X 3, normal strength and tone. Normal symmetric reflexes. Normal coordination and gait   Adult ECG Report  Rate: 71 ;  Rhythm: normal sinus rhythm, normal axis and intervals  Narrative Interpretation:  Normal EKG  Recent Labs: None since last year  ASSESSMENT / PLAN: Ascending aortic aneurysm - 4.2 cm Stable ascending TAA - will follow one more annual evaluation -- if stable, the would only follow every 2-3 years. Maintain stable BP control -- he is well within normal limits; Lipids were at goal - continue to monitor.   Intermittent palpitations No longer an issue. Continue to avoid XS caffeine.     Orders Placed This Encounter  Procedures  . EKG 12-Lead    Followup: One year  DAVID W. Herbie BaltimoreHARDING, M.D.,  M.S. Interventional Cardiology [facility]

## 2015-03-14 ENCOUNTER — Telehealth: Payer: Self-pay | Admitting: *Deleted

## 2015-03-14 DIAGNOSIS — I712 Thoracic aortic aneurysm, without rupture: Secondary | ICD-10-CM

## 2015-03-14 DIAGNOSIS — Z01818 Encounter for other preprocedural examination: Secondary | ICD-10-CM

## 2015-03-14 DIAGNOSIS — I7121 Aneurysm of the ascending aorta, without rupture: Secondary | ICD-10-CM

## 2015-03-14 NOTE — Telephone Encounter (Signed)
Spoke with patient regarding CTA of chest and abd ordered by Dr. Herbie Baltimore.  Scheduled Friday 03/23/15 at 10:30 am--arrival time 10:10 am at [facility] [address]  Patient also instructed to get labs this week.  He voiced his understanding.

## 2015-03-14 NOTE — Telephone Encounter (Signed)
-----   Message from Tobin Chad, RN sent at 03/29/2014  4:42 PM EST ----- SCHEDULE CT AORTA ANEURYSM  AFTER 03/10/15 CALL PATIENT TO SCHEDULE  03/02/15

## 2015-03-14 NOTE — Telephone Encounter (Signed)
Order placed for annual  CT scan for aorta Labs BUN,CREATININE ORDER

## 2015-03-16 LAB — BUN: BUN: 16 mg/dL (ref 7–25)

## 2015-03-16 LAB — CREATININE, SERUM: Creat: 0.99 mg/dL (ref 0.60–1.35)

## 2015-03-23 ENCOUNTER — Ambulatory Visit
Admission: RE | Admit: 2015-03-23 | Discharge: 2015-03-23 | Disposition: A | Discharge status: Home / Self Care | Payer: BLUE CROSS/BLUE SHIELD | Source: Ambulatory Visit | Attending: Cardiology | Admitting: Cardiology

## 2015-03-23 DIAGNOSIS — I712 Thoracic aortic aneurysm, without rupture: Secondary | ICD-10-CM

## 2015-03-23 DIAGNOSIS — I7121 Aneurysm of the ascending aorta, without rupture: Secondary | ICD-10-CM

## 2015-03-23 MED ORDER — IOPAMIDOL (ISOVUE-370) INJECTION 76%
100.0000 mL | Freq: Once | INTRAVENOUS | Status: AC | PRN
  Administered 2015-03-23: 100 mL via INTRAVENOUS

## 2015-03-27 ENCOUNTER — Telehealth: Payer: Self-pay | Admitting: Cardiology

## 2015-03-27 NOTE — Telephone Encounter (Signed)
Goes straight to VM. Left msg for pt to call.

## 2015-03-27 NOTE — Telephone Encounter (Signed)
New message  ° ° ° °Pt is calling for lab results °

## 2015-03-28 NOTE — Telephone Encounter (Signed)
Follow Up ° °Pt returned call//  °

## 2015-03-28 NOTE — Telephone Encounter (Signed)
Results given, pt verbalized understanding. 

## 2015-04-24 ENCOUNTER — Encounter: Payer: Self-pay | Admitting: Cardiology

## 2015-04-24 ENCOUNTER — Ambulatory Visit (INDEPENDENT_AMBULATORY_CARE_PROVIDER_SITE_OTHER): Payer: BLUE CROSS/BLUE SHIELD | Admitting: Cardiology

## 2015-04-24 VITALS — BP 116/64 | HR 83 | Ht 73.0 in | Wt 201.0 lb

## 2015-04-24 DIAGNOSIS — I7121 Aneurysm of the ascending aorta, without rupture: Secondary | ICD-10-CM

## 2015-04-24 DIAGNOSIS — R002 Palpitations: Secondary | ICD-10-CM

## 2015-04-24 DIAGNOSIS — I712 Thoracic aortic aneurysm, without rupture: Secondary | ICD-10-CM | POA: Diagnosis not present

## 2015-04-24 NOTE — Patient Instructions (Signed)
NO CHANGE WITH CURRENT MEDICATIONS  WILL RECHECK CT SCAN IN 2 YEARS AN THE A FOLLOW UP APPOINTMENT WITH DR HARDING.

## 2015-04-24 NOTE — Progress Notes (Signed)
PCP: Farris HasMORROW, AARON, MD  Clinic Note: Chief Complaint  Patient presents with  . Annual Exam    Dilated ascending aorta    HPI: Charles Skinner is a 45 y.o. male with a PMH below who presents today for 1 year follow-up for CT scan diagnosis of dilated ascending aorta.  He had a CTA of the chest for PE back in 2014 that suggested a 4.2 cm dilated ascending aorta.  Repeat aortic CTA in 2016 showed a relatively stable ascending aorta at 4.1 cm. He just returns now after 1 year follow-up with stable 4.1 cm dilation. He does not have a murmur to suggest any bicuspid aortic valve or any other aortic valve and malaise. He has no Marfan's body habitus, but is just simply tall.  Charles Skinner was last seen in March 2016. Doing well at that time. No complaints at all.  Recent Hospitalizations: None  Studies Reviewed:  CTA Chest for aortic evaluation: Some motion artifact. Goal dilation of ascending aorta of 4.1 cm. Only 3.8 at sinus Valsalva. The proximal arch is normal at 3.1 and descending artery 2.3. Normal taper distally. Stable truncus anomaly noted  Interval History: Charles Skinner presents today he has no complaints. He is not had any complaints at all. No chest discomfort or tearing sensation. No resting or exertional dyspnea. No syncope/near syncope or TIAs amaurosis fugax. Denies any headaches or blurred vision. Overall very healthy with no major complaints.   ROS: A comprehensive was performed. Review of Systems  Constitutional: Negative for weight loss and malaise/fatigue.  HENT: Negative for nosebleeds.   Eyes: Negative for blurred vision and double vision.  Respiratory: Negative for cough, shortness of breath and wheezing.   Cardiovascular: Negative.        Per history of present illness  Gastrointestinal: Negative for blood in stool and melena.  Genitourinary: Negative for hematuria and flank pain.  Musculoskeletal: Negative for myalgias, joint pain and falls.  Neurological: Negative for  dizziness, sensory change, speech change, focal weakness, seizures, loss of consciousness and headaches.  Psychiatric/Behavioral: Negative for depression and memory loss. The patient is not nervous/anxious and does not have insomnia.   All other systems reviewed and are negative.   Past Medical History  Diagnosis Date  . Sinus tachycardia (HCC)   . Ascending aortic aneurysm (HCC) February 2014    4.2 cm by CT scan  . Intermittent palpitations   . Helicobacter pylori (H. pylori) infection      status post antibiotic and PPI treatment     Past Surgical History  Procedure Laterality Date  . Knee surgery  1996    ACL - left knee    Prior to Admission medications   Medication Sig Start Date End Date Taking? Authorizing Provider  Cholecalciferol (VITAMIN D) 2000 UNITS CAPS Take 1 capsule by mouth daily.   Yes Historical Provider, MD  Flaxseed, Linseed, (FLAXSEED OIL) 1000 MG CAPS Take 1 capsule by mouth daily.   Yes Historical Provider, MD  vitamin C (ASCORBIC ACID) 500 MG tablet Take 500 mg by mouth daily.   Yes Historical Provider, MD   No Known Allergies  Social History   Social History  . Marital Status: Married    Spouse Name: N/A  . Number of Children: 2  . Years of Education: 12   Occupational History  . tile installer    Social History Main Topics  . Smoking status: Never Smoker   . Smokeless tobacco: Never Used  . Alcohol Use:  No  . Drug Use: No  . Sexual Activity: Not Asked   Other Topics Concern  . 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   Family History  Problem Relation Age of Onset  . Heart attack Mother 46    Wt Readings from Last 3 Encounters:  04/24/15 201 lb (91.173 kg)  03/29/14 207 lb 14.4 oz (94.303 kg)  05/26/13 201 lb 14.4 oz (91.581 kg)    PHYSICAL EXAM BP 116/64 mmHg  Pulse 83  Ht  (1.854 m)  Wt 201 lb (91.173 kg)   BMI 26.52 kg/m2 General appearance: alert, cooperative, appears stated age, no distress and Well-nourished, well-groomed Neck: no adenopathy, no carotid bruit, no JVD and supple, symmetrical, trachea midline Lungs: clear to auscultation bilaterally, normal percussion bilaterally and Nonlabored, good air movement Heart: regular rate and rhythm, S1, S2 normal, no murmur, click, rub or gallop Abdomen: soft, non-tender; bowel sounds normal; no masses, no organomegaly Extremities: extremities normal, atraumatic, no cyanosis or edema Pulses: 2+ and symmetric Neurologic: Alert and oriented X 3, normal strength and tone. Normal symmetric reflexes. Normal coordination and gait    Adult ECG Report  Rate: 83 ;  Rhythm: normal sinus rhythm, premature ventricular contractions (PVC) and Normal axis, intervals and durations. Normal voltage with possible left atrial enlargement;   Narrative Interpretation: Stable EKG   Other studies Reviewed: Additional studies/ records that were reviewed today include:  Recent Labs:  None available   ASSESSMENT / PLAN: Problem List Items Addressed This Visit    RESOLVED: Intermittent palpitations (Chronic)   Relevant Orders   EKG 12-Lead (Completed)   CT ANGIO CHEST AORTA W/CM &/OR WO/CM   Ascending aortic aneurysm - 4.2 cm - Primary (Chronic)    Yet again stable findings on CT angiography. This is just ectatic dilation. Not a true aneurysm. At this point has been stable for 3 years, we can recheck in 2-3 years. May consider checking an echocardiogram if the "truncus abnormality"  is still noted.  Just doesn't have any abnormal exam findings to make me feel compelled to check an echo.  Plan is to maintain stable blood pressure control (which it is) his lipids at goal the past. As healthy as he is without significant obesity, he should be okay for a while. Will defer to PCP.      Relevant Orders   EKG 12-Lead (Completed)   CT ANGIO CHEST AORTA W/CM &/OR WO/CM     Palpitations are essentially all resolved.  Current medicines are reviewed at length with the patient today. (+/- concerns) None The following changes have been made: None Studies Ordered:   Orders Placed This Encounter  Procedures  . CT ANGIO CHEST AORTA W/CM &/OR WO/CM  . EKG 12-Lead   Follow-up in 2 years after scan   Marykay Lex, M.D., M.S. Interventional Cardiologist   Pager # [phone] Phone # [phone] [address] [place], [place] [address]

## 2015-04-26 ENCOUNTER — Encounter: Payer: Self-pay | Admitting: Cardiology

## 2015-04-26 NOTE — Assessment & Plan Note (Signed)
Yet again stable findings on CT angiography. This is just ectatic dilation. Not a true aneurysm. At this point has been stable for 3 years, we can recheck in 2-3 years. May consider checking an echocardiogram if the "truncus abnormality"  is still noted.  Just doesn't have any abnormal exam findings to make me feel compelled to check an echo.  Plan is to maintain stable blood pressure control (which it is) his lipids at goal the past. As healthy as he is without significant obesity, he should be okay for a while. Will defer to PCP.

## 2015-06-08 DIAGNOSIS — R1030 Lower abdominal pain, unspecified: Secondary | ICD-10-CM | POA: Diagnosis not present

## 2015-06-08 DIAGNOSIS — Z0000 Encounter for general adult medical examination without abnormal findings: Secondary | ICD-10-CM | POA: Diagnosis not present

## 2015-06-08 DIAGNOSIS — Z13220 Encounter for screening for lipoid disorders: Secondary | ICD-10-CM | POA: Diagnosis not present

## 2015-06-08 DIAGNOSIS — Z125 Encounter for screening for malignant neoplasm of prostate: Secondary | ICD-10-CM | POA: Diagnosis not present

## 2015-06-15 DIAGNOSIS — R109 Unspecified abdominal pain: Secondary | ICD-10-CM | POA: Diagnosis not present

## 2015-07-19 DIAGNOSIS — R1030 Lower abdominal pain, unspecified: Secondary | ICD-10-CM | POA: Diagnosis not present

## 2015-07-19 DIAGNOSIS — R109 Unspecified abdominal pain: Secondary | ICD-10-CM | POA: Diagnosis not present

## 2015-08-08 ENCOUNTER — Ambulatory Visit: Payer: Self-pay | Admitting: General Surgery

## 2015-08-08 DIAGNOSIS — K4020 Bilateral inguinal hernia, without obstruction or gangrene, not specified as recurrent: Secondary | ICD-10-CM | POA: Diagnosis not present

## 2015-08-21 DIAGNOSIS — R1030 Lower abdominal pain, unspecified: Secondary | ICD-10-CM | POA: Diagnosis not present

## 2015-10-15 ENCOUNTER — Encounter (HOSPITAL_BASED_OUTPATIENT_CLINIC_OR_DEPARTMENT_OTHER): Payer: Self-pay | Admitting: *Deleted

## 2015-10-15 NOTE — Progress Notes (Signed)
NPO AFTER MN.  ARRIVE AT 0700.  NEEDS HG.  WILL DO HIBICLENS SHOWER HS BEFORE AND AM DOS.

## 2015-10-17 ENCOUNTER — Ambulatory Visit (HOSPITAL_BASED_OUTPATIENT_CLINIC_OR_DEPARTMENT_OTHER): Payer: BLUE CROSS/BLUE SHIELD | Admitting: Anesthesiology

## 2015-10-17 ENCOUNTER — Encounter (HOSPITAL_BASED_OUTPATIENT_CLINIC_OR_DEPARTMENT_OTHER): Payer: Self-pay | Admitting: *Deleted

## 2015-10-17 ENCOUNTER — Encounter (HOSPITAL_BASED_OUTPATIENT_CLINIC_OR_DEPARTMENT_OTHER)
Admission: RE | Disposition: A | Discharge status: Home / Self Care | Payer: Self-pay | Source: Ambulatory Visit | Attending: General Surgery

## 2015-10-17 ENCOUNTER — Ambulatory Visit (HOSPITAL_BASED_OUTPATIENT_CLINIC_OR_DEPARTMENT_OTHER)
Admission: RE | Admit: 2015-10-17 | Discharge: 2015-10-17 | Disposition: A | Discharge status: Home / Self Care | Payer: BLUE CROSS/BLUE SHIELD | Source: Ambulatory Visit | Attending: General Surgery | Admitting: General Surgery

## 2015-10-17 DIAGNOSIS — G4730 Sleep apnea, unspecified: Secondary | ICD-10-CM | POA: Diagnosis not present

## 2015-10-17 DIAGNOSIS — K4020 Bilateral inguinal hernia, without obstruction or gangrene, not specified as recurrent: Secondary | ICD-10-CM | POA: Insufficient documentation

## 2015-10-17 DIAGNOSIS — Z79899 Other long term (current) drug therapy: Secondary | ICD-10-CM | POA: Insufficient documentation

## 2015-10-17 DIAGNOSIS — G4733 Obstructive sleep apnea (adult) (pediatric): Secondary | ICD-10-CM | POA: Insufficient documentation

## 2015-10-17 HISTORY — PX: INSERTION OF MESH: SHX5868

## 2015-10-17 HISTORY — PX: INGUINAL HERNIA REPAIR: SHX194

## 2015-10-17 HISTORY — DX: Bilateral inguinal hernia, without obstruction or gangrene, not specified as recurrent: K40.20

## 2015-10-17 HISTORY — DX: Personal history of other infectious and parasitic diseases: Z86.19

## 2015-10-17 HISTORY — DX: Obstructive sleep apnea (adult) (pediatric): G47.33

## 2015-10-17 HISTORY — DX: Personal history of other specified conditions: Z87.898

## 2015-10-17 LAB — POCT HEMOGLOBIN-HEMACUE: HEMOGLOBIN: 15.7 g/dL (ref 13.0–17.0)

## 2015-10-17 SURGERY — REPAIR, HERNIA, INGUINAL, BILATERAL, LAPAROSCOPIC
Anesthesia: General | Site: Groin

## 2015-10-17 MED ORDER — SUGAMMADEX SODIUM 200 MG/2ML IV SOLN
INTRAVENOUS | Status: AC
  Filled 2015-10-17: qty 2

## 2015-10-17 MED ORDER — ACETAMINOPHEN 500 MG PO TABS
1000.0000 mg | ORAL_TABLET | Freq: Once | ORAL | Status: DC
  Filled 2015-10-17: qty 2

## 2015-10-17 MED ORDER — IBUPROFEN 800 MG PO TABS: 800.0000 mg | ORAL_TABLET | Freq: Three times a day (TID) | ORAL | 0 refills | Status: DC | PRN

## 2015-10-17 MED ORDER — HYDROCODONE-ACETAMINOPHEN 5-325 MG PO TABS: 1.0000 | ORAL_TABLET | Freq: Four times a day (QID) | ORAL | 0 refills | Status: DC | PRN

## 2015-10-17 MED ORDER — ONDANSETRON HCL 4 MG/2ML IJ SOLN
INTRAMUSCULAR | Status: DC | PRN
  Administered 2015-10-17: 4 mg via INTRAVENOUS

## 2015-10-17 MED ORDER — FENTANYL CITRATE (PF) 100 MCG/2ML IJ SOLN
INTRAMUSCULAR | Status: DC | PRN
  Administered 2015-10-17 (×2): 50 ug via INTRAVENOUS
  Administered 2015-10-17: 100 ug via INTRAVENOUS

## 2015-10-17 MED ORDER — LACTATED RINGERS IV SOLN
INTRAVENOUS | Status: DC
  Administered 2015-10-17 (×3): via INTRAVENOUS
  Filled 2015-10-17: qty 1000

## 2015-10-17 MED ORDER — ACETAMINOPHEN 500 MG PO TABS
1000.0000 mg | ORAL_TABLET | ORAL | Status: AC
  Administered 2015-10-17: 1000 mg via ORAL
  Filled 2015-10-17: qty 2

## 2015-10-17 MED ORDER — CHLORHEXIDINE GLUCONATE CLOTH 2 % EX PADS
6.0000 | MEDICATED_PAD | Freq: Once | CUTANEOUS | Status: DC
  Filled 2015-10-17: qty 6

## 2015-10-17 MED ORDER — PROPOFOL 500 MG/50ML IV EMUL
INTRAVENOUS | Status: AC
  Filled 2015-10-17: qty 100

## 2015-10-17 MED ORDER — CEFAZOLIN SODIUM-DEXTROSE 2-4 GM/100ML-% IV SOLN
INTRAVENOUS | Status: AC
  Filled 2015-10-17: qty 100

## 2015-10-17 MED ORDER — PROPOFOL 10 MG/ML IV BOLUS
INTRAVENOUS | Status: DC | PRN
  Administered 2015-10-17: 200 mg via INTRAVENOUS

## 2015-10-17 MED ORDER — PROMETHAZINE HCL 25 MG/ML IJ SOLN
6.2500 mg | INTRAMUSCULAR | Status: DC | PRN
  Filled 2015-10-17: qty 1

## 2015-10-17 MED ORDER — DEXAMETHASONE SODIUM PHOSPHATE 4 MG/ML IJ SOLN
INTRAMUSCULAR | Status: DC | PRN
  Administered 2015-10-17: 10 mg via INTRAVENOUS

## 2015-10-17 MED ORDER — GABAPENTIN 300 MG PO CAPS
300.0000 mg | ORAL_CAPSULE | ORAL | Status: AC
  Administered 2015-10-17: 300 mg via ORAL
  Filled 2015-10-17 (×2): qty 1

## 2015-10-17 MED ORDER — FENTANYL CITRATE (PF) 100 MCG/2ML IJ SOLN
INTRAMUSCULAR | Status: AC
Start: 2015-10-17 — End: 2015-10-17
  Filled 2015-10-17: qty 4

## 2015-10-17 MED ORDER — FENTANYL CITRATE (PF) 100 MCG/2ML IJ SOLN
25.0000 ug | INTRAMUSCULAR | Status: DC | PRN
  Filled 2015-10-17: qty 1

## 2015-10-17 MED ORDER — BUPIVACAINE LIPOSOME 1.3 % IJ SUSP
INTRAMUSCULAR | Status: DC | PRN
  Administered 2015-10-17: 40 mL

## 2015-10-17 MED ORDER — CEFAZOLIN SODIUM-DEXTROSE 2-4 GM/100ML-% IV SOLN
2.0000 g | INTRAVENOUS | Status: AC
  Administered 2015-10-17: 2 g via INTRAVENOUS
  Filled 2015-10-17: qty 100

## 2015-10-17 MED ORDER — SODIUM CHLORIDE 0.9 % IR SOLN
Status: DC | PRN
  Administered 2015-10-17: 1000 mL

## 2015-10-17 MED ORDER — PHENYLEPHRINE 40 MCG/ML (10ML) SYRINGE FOR IV PUSH (FOR BLOOD PRESSURE SUPPORT)
PREFILLED_SYRINGE | INTRAVENOUS | Status: AC
  Filled 2015-10-17: qty 10

## 2015-10-17 MED ORDER — SUGAMMADEX SODIUM 200 MG/2ML IV SOLN
INTRAVENOUS | Status: DC | PRN
  Administered 2015-10-17: 400 mg via INTRAVENOUS

## 2015-10-17 MED ORDER — ROCURONIUM BROMIDE 10 MG/ML (PF) SYRINGE
PREFILLED_SYRINGE | INTRAVENOUS | Status: AC
  Filled 2015-10-17: qty 10

## 2015-10-17 MED ORDER — LIDOCAINE 2% (20 MG/ML) 5 ML SYRINGE
INTRAMUSCULAR | Status: DC | PRN
  Administered 2015-10-17: 40 mg via INTRAVENOUS

## 2015-10-17 MED ORDER — MIDAZOLAM HCL 2 MG/2ML IJ SOLN
INTRAMUSCULAR | Status: AC
  Filled 2015-10-17: qty 2

## 2015-10-17 MED ORDER — OXYCODONE HCL 5 MG PO TABS
5.0000 mg | ORAL_TABLET | Freq: Once | ORAL | Status: AC
  Administered 2015-10-17: 5 mg via ORAL
  Filled 2015-10-17: qty 1

## 2015-10-17 MED ORDER — ROCURONIUM BROMIDE 10 MG/ML (PF) SYRINGE
PREFILLED_SYRINGE | INTRAVENOUS | Status: DC | PRN
  Administered 2015-10-17 (×2): 10 mg via INTRAVENOUS
  Administered 2015-10-17: 55 mg via INTRAVENOUS

## 2015-10-17 MED ORDER — OXYCODONE HCL 5 MG PO TABS
ORAL_TABLET | ORAL | Status: AC
  Filled 2015-10-17: qty 1

## 2015-10-17 MED ORDER — EPHEDRINE 5 MG/ML INJ
INTRAVENOUS | Status: AC
  Filled 2015-10-17: qty 10

## 2015-10-17 MED ORDER — ARTIFICIAL TEARS OP OINT
TOPICAL_OINTMENT | OPHTHALMIC | Status: AC
  Filled 2015-10-17: qty 3.5

## 2015-10-17 MED ORDER — LIDOCAINE 2% (20 MG/ML) 5 ML SYRINGE
INTRAMUSCULAR | Status: AC
  Filled 2015-10-17: qty 5

## 2015-10-17 MED ORDER — LIDOCAINE 2% (20 MG/ML) 5 ML SYRINGE
INTRAMUSCULAR | Status: AC
  Filled 2015-10-17: qty 10

## 2015-10-17 MED ORDER — CELECOXIB 400 MG PO CAPS
400.0000 mg | ORAL_CAPSULE | ORAL | Status: AC
  Administered 2015-10-17: 400 mg via ORAL
  Filled 2015-10-17 (×2): qty 1

## 2015-10-17 MED ORDER — LIDOCAINE 2% (20 MG/ML) 5 ML SYRINGE
INTRAMUSCULAR | Status: AC
  Filled 2015-10-17: qty 15

## 2015-10-17 MED ORDER — ACETAMINOPHEN 500 MG PO TABS
ORAL_TABLET | ORAL | Status: AC
  Filled 2015-10-17: qty 2

## 2015-10-17 SURGICAL SUPPLY — 42 items
BLADE 11 SAFETY STRL DISP (BLADE) ×3 IMPLANT
BLADE CLIPPER SENSICLIP SURGIC (BLADE) ×3 IMPLANT
CABLE HIGH FREQUENCY MONO STRZ (ELECTRODE) ×3 IMPLANT
CHLORAPREP W/TINT 26ML (MISCELLANEOUS) ×3 IMPLANT
COVER BACK TABLE 60X90IN (DRAPES) ×3 IMPLANT
COVER MAYO STAND STRL (DRAPES) ×3 IMPLANT
DEVICE SECURE STRAP 25 ABSORB (INSTRUMENTS) ×2 IMPLANT
DISSECT BALLN SPACEMKR + OVL (BALLOONS) ×3
DISSECTOR BALLN SPACEMKR + OVL (BALLOONS) ×2 IMPLANT
DRAPE LAPAROSCOPIC ABDOMINAL (DRAPES) ×3 IMPLANT
DRAPE UTILITY XL STRL (DRAPES) ×3 IMPLANT
ELECT REM PT RETURN 9FT ADLT (ELECTROSURGICAL) ×3
ELECTRODE REM PT RTRN 9FT ADLT (ELECTROSURGICAL) ×2 IMPLANT
GLOVE BIOGEL PI IND STRL 7.0 (GLOVE) ×3 IMPLANT
GLOVE BIOGEL PI IND STRL 7.5 (GLOVE) IMPLANT
GLOVE BIOGEL PI INDICATOR 7.0 (GLOVE) ×2
GLOVE BIOGEL PI INDICATOR 7.5 (GLOVE) ×2
GLOVE SURG SS PI 7.0 STRL IVOR (GLOVE) ×4 IMPLANT
GOWN STRL REUS W/TWL LRG LVL3 (GOWN DISPOSABLE) ×5 IMPLANT
IV NS 1000ML (IV SOLUTION) ×3
IV NS 1000ML BAXH (IV SOLUTION) IMPLANT
LIQUID BAND (GAUZE/BANDAGES/DRESSINGS) ×3 IMPLANT
MESH 3DMAX LIGHT 4.1X6.2 LT LR (Mesh General) ×2 IMPLANT
MESH 3DMAX LIGHT 4.1X6.2 RT LR (Mesh General) ×1 IMPLANT
NDL HYPO 25X1 1.5 SAFETY (NEEDLE) ×2 IMPLANT
NEEDLE HYPO 25X1 1.5 SAFETY (NEEDLE) ×3 IMPLANT
PACK BASIN DAY SURGERY FS (CUSTOM PROCEDURE TRAY) ×3 IMPLANT
PADDING ION DISPOSABLE (MISCELLANEOUS) ×4 IMPLANT
SCISSORS LAP 5X35 DISP (ENDOMECHANICALS) IMPLANT
SET IRRIG TUBING LAPAROSCOPIC (IRRIGATION / IRRIGATOR) ×3 IMPLANT
SHEARS HARMONIC ACE PLUS 36CM (ENDOMECHANICALS) IMPLANT
SOLUTION ANTI FOG 6CC (MISCELLANEOUS) ×3 IMPLANT
SUT MNCRL AB 4-0 PS2 18 (SUTURE) ×3 IMPLANT
SUT VIC AB 2-0 SH 27 (SUTURE)
SUT VIC AB 2-0 SH 27X BRD (SUTURE) IMPLANT
SUT VICRYL 0 UR6 27IN ABS (SUTURE) ×3 IMPLANT
SUT VLOC BARB 180 ABS3/0GR12 (SUTURE)
SUTURE VLOC BRB 180 ABS3/0GR12 (SUTURE) IMPLANT
SYR CONTROL 10ML LL (SYRINGE) ×3 IMPLANT
TOWEL OR 17X24 6PK STRL BLUE (TOWEL DISPOSABLE) ×6 IMPLANT
TROCAR CANNULA W/PORT DUAL 5MM (MISCELLANEOUS) ×3 IMPLANT
TUBING INSUF HEATED (TUBING) ×3 IMPLANT

## 2015-10-17 NOTE — Op Note (Signed)
Preop diagnosis: bilateral inguinal hernia  Postop diagnosis: bilateral indirect inguinal hernia  Procedure: laparoscopic Bilateral inguinal hernia repair with mesh  Surgeon: Feliciana RossettiLuke Kinsinger, M.D.  Asst: none  Anesthesia: Gen.   Indications for procedure: Charles SprangJeff W Skinner is a 45 y.o. male with symptoms of pain and enlarging Bilateral inguinal hernia(s). After discussing risks, alternatives and benefits he decided on laparoscopic repair and was brought to day surgery for repair.  Description of procedure: The patient was brought into the operative suite, placed supine. Anesthesia was administered with endotracheal tube. Patient was strapped in place. The patient was prepped and draped in the usual sterile fashion.  Next quarter percent Marcaine was injected to the Left of the umbilicus, and a transverse 2 cm incision was made. Dissection was used to visualize the anterior rectus sheath. Anterior rectus sheath was sharply incised, next to the 10 mm balloon dissecting trocar was inserted without resistance. Laparoscope was inserted the balloon was inflated under direct visualization. Balloon portion of the trocar was removed laparoscope was inserted and pneumoperitoneum was applied. 0.25% Marcaine was used then used to anesthetize the pubic area, and one area 5 cm superior in the middle area. 1 cm incisions were made over each these areas and 5 mm trocar was inserted under direct visualization.  On initial visualization , the right dissection was poor, the left side showed a moderate sized indirect inguinal hernia. First, blunt dissection occurred to free the peritoneum away from the abdominal wall on the right side. Next we began our dissection identifying the ASIS laterally and then working back medially removing the filmy tissue and adhesions of the peritoneum to the abdominal wall. Hernia sac was completely dissected out of the canal. Vas deference and contents of the cord were safely dissected away of  the hernia sac.   Next, we began our dissection on the left side identifying the ASIS laterally and then working back medially removing the filmy tissue and adhesions of the peritoneum to the abdominal wall. Hernia sac was completely dissected out of the canal. Vas deference and contents of the cord were safely dissected away of the hernia sac.   A 3D max light weight mesh was inserted and tacked medially to the lacunar ligament on the left side. One additional tack was used in the lateral anterior space. The mesh was positioned flat and directly up against the direct and indirect areas. A 3D max light weight mesh was inserted and tacked medially to the lacunar ligament on the right side. The CO2 was evacuated while watching to ensure the mesh did not migrate. The remainder of the Exparel mix was infused along the line of the inguinal ligament deep to the fascia. The anterior rectus fascia was closed with 0 vicryl in interrupted sutures and all skin incisions were closed with 4-0 monocryl subcu stitch. The patient awoke from anesthesia and was brought to PACU in stable condition.  Findings: bilateral indirect inguinal hernia  Specimen: none  Blood loss: 50 ml  Local anesthesia: 40 ml 1:1 Exparel:Saline  Complications: none  Implant: large right 3dmax light mesh, large left 3dmax light mesh  Feliciana RossettiLuke Kinsinger, M.D. General, Bariatric, & Minimally Invasive Surgery [facility], [place] 10:12 AM 10/17/2015

## 2015-10-17 NOTE — Anesthesia Postprocedure Evaluation (Signed)
Anesthesia Post Note  Patient: Matilde SprangJeff W Nicotra  Procedure(s) Performed: Procedure(s) (LRB): LAPAROSCOPIC BILATERAL INGUINAL HERNIA REPAIR (N/A) INSERTION OF MESH (Bilateral)  Patient location during evaluation: PACU Anesthesia Type: General Level of consciousness: awake and alert Pain management: pain level controlled Vital Signs Assessment: post-procedure vital signs reviewed and stable Respiratory status: spontaneous breathing, nonlabored ventilation, respiratory function stable and patient connected to nasal cannula oxygen Cardiovascular status: blood pressure returned to baseline and stable Postop Assessment: no signs of nausea or vomiting Anesthetic complications: no    Last Vitals:  Vitals:   10/17/15 1045 10/17/15 1100  BP: 118/73 118/80  Pulse: 83 79  Resp: 16 18  Temp:      Last Pain:  Vitals:   10/17/15 1045  TempSrc:   PainSc: 1                  Rhone Ozaki J

## 2015-10-17 NOTE — H&P (Signed)
Charles SprangJeff W Skinner is an 45 y.o. male.   Chief Complaint: inguinal hernia HPI: 45 yo male with symptomatic left inguinal hernia. 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 He has had to manually the reduce the hernia occasionally. He denies nausea or vomiting.  Past Medical History:  Diagnosis Date  . Ascending aortic aneurysm [facility](HCC) dx February 2014 incidental finding on CT    per last CTA 03-23-2015 --  AAA  4.2cm   (monitored by dr harding (cardiologist)  . Bilateral inguinal hernia   . History of Helicobacter pylori infection    2014-- resolved w/ antibiotics  . History of palpitations    2014--  resolved  . Mild obstructive sleep apnea    PER PT NO CPAP RECOMMENDATION    Past Surgical History:  Procedure Laterality Date  . ANTERIOR CRUCIATE LIGAMENT REPAIR Left 1996    Family History  Problem Relation Age of Onset  . Heart attack Mother [id number]   Social History:  reports that he has never smoked. He has never used smokeless tobacco. He reports that he does not drink alcohol or use drugs.  Allergies: No Known Allergies  Medications Prior to Admission  Medication Sig Dispense Refill  . Cholecalciferol (VITAMIN D) 2000 UNITS CAPS Take 1 capsule by mouth daily.    . Flaxseed, Linseed, (FLAXSEED OIL) 1000 MG CAPS Take 1 capsule by mouth daily.    . Probiotic Product (PROBIOTIC DAILY) CAPS Take 1 capsule by mouth daily.    . vitamin C (ASCORBIC ACID) 500 MG tablet Take 500 mg by mouth daily.      No results found for this or any previous visit (from the past 48 hour(s)). No results found.  Review of Systems  Constitutional: Negative for chills and fever.  HENT: Negative for hearing loss.   Eyes: Negative for blurred vision and double vision.  Respiratory: Negative for cough and hemoptysis.   Cardiovascular: Negative for chest pain and palpitations.  Gastrointestinal: Negative for abdominal pain, nausea and vomiting.  Genitourinary: Negative for  dysuria and urgency.  Musculoskeletal: Negative for myalgias and neck pain.  Skin: Negative for itching and rash.  Neurological: Negative for dizziness, tingling and headaches.  Endo/Heme/Allergies: Does not bruise/bleed easily.  Psychiatric/Behavioral: Negative for depression and suicidal ideas.    Blood pressure 123/79, pulse 75, temperature 97.7 F (36.5 C), temperature source Oral, resp. rate 16, height 6\' 1"  (1.854 m), weight 91.6 kg (202 lb), SpO2 97 %. Physical Exam  Vitals reviewed. Constitutional: He is oriented to person, place, and time. He appears well-developed and well-nourished.  HENT:  Head: Normocephalic and atraumatic.  Eyes: Conjunctivae and EOM are normal. Pupils are equal, round, and reactive to light.  Neck: Normal range of motion. Neck supple.  Cardiovascular: Normal rate and regular rhythm.   Respiratory: Effort normal and breath sounds normal.  GI: Soft. Bowel sounds are normal. He exhibits no distension. There is no tenderness.  Moderate size left inguinal hernia, small right fascial defect  Musculoskeletal: Normal range of motion.  Neurological: He is alert and oriented to person, place, and time.  Skin: Skin is warm and dry.  Psychiatric: He has a normal mood and affect. His behavior is normal.     Assessment/Plan 45 yo male with bilateral inguinal hernias -lap bilateral inguinal hernia repair -plan outpatient procedure  Rodman PickleLuke Aaron Samaira Holzworth, MD 10/17/2015, 7:44 AM

## 2015-10-17 NOTE — Anesthesia Preprocedure Evaluation (Addendum)
Anesthesia Evaluation  Patient identified by MRN, date of birth, ID band Patient awake    Reviewed: Allergy & Precautions, NPO status , Patient's Chart, lab work & pertinent test results  Airway Mallampati: II  TM Distance: >3 FB Neck ROM: Full    Dental no notable dental hx. (+) Teeth Intact, Dental Advisory Given   Pulmonary sleep apnea ,    Pulmonary exam normal breath sounds clear to auscultation       Cardiovascular Exercise Tolerance: Good + Peripheral Vascular Disease  Normal cardiovascular exam Rhythm:Regular Rate:Normal  4.2 cm AAA   Neuro/Psych negative neurological ROS  negative psych ROS   GI/Hepatic negative GI ROS, Neg liver ROS,   Endo/Other  negative endocrine ROS  Renal/GU negative Renal ROS  negative genitourinary   Musculoskeletal negative musculoskeletal ROS (+)   Abdominal   Peds negative pediatric ROS (+)  Hematology negative hematology ROS (+)   Anesthesia Other Findings   Reproductive/Obstetrics negative OB ROS                            Anesthesia Physical Anesthesia Plan  ASA: II  Anesthesia Plan: General   Post-op Pain Management:    Induction: Intravenous  Airway Management Planned: Oral ETT  Additional Equipment:   Intra-op Plan:   Post-operative Plan: Extubation in OR  Informed Consent: I have reviewed the patients History and Physical, chart, labs and discussed the procedure including the risks, benefits and alternatives for the proposed anesthesia with the patient or authorized representative who has indicated his/her understanding and acceptance.   Dental advisory given  Plan Discussed with: CRNA and Anesthesiologist  Anesthesia Plan Comments:        Anesthesia Quick Evaluation

## 2015-10-17 NOTE — Anesthesia Procedure Notes (Signed)
Procedure Name: Intubation Date/Time: 10/17/2015 8:35 AM Performed by: Tyrone NineSAUVE, Charod Slawinski F Pre-anesthesia Checklist: Patient identified, Timeout performed, Emergency Drugs available, Suction available and Patient being monitored Patient Re-evaluated:Patient Re-evaluated prior to inductionOxygen Delivery Method: Circle system utilized Preoxygenation: Pre-oxygenation with 100% oxygen Intubation Type: IV induction Ventilation: Oral airway inserted - appropriate to patient size and Mask ventilation without difficulty Laryngoscope Size: Mac and 3 Grade View: Grade II Tube type: Oral Tube size: 8.0 mm Number of attempts: 1 Airway Equipment and Method: Stylet Placement Confirmation: ETT inserted through vocal cords under direct vision,  positive ETCO2 and breath sounds checked- equal and bilateral Secured at: 22 cm Tube secured with: Tape Dental Injury: Teeth and Oropharynx as per pre-operative assessment

## 2015-10-17 NOTE — Transfer of Care (Signed)
Immediate Anesthesia Transfer of Care Note  Patient: Charles Skinner  Procedure(s) Performed: Procedure(s): LAPAROSCOPIC BILATERAL INGUINAL HERNIA REPAIR (N/A) INSERTION OF MESH (Bilateral)  Patient Location: PACU  Anesthesia Type:General  Level of Consciousness: awake, alert , oriented and patient cooperative  Airway & Oxygen Therapy: Patient Spontanous Breathing and Patient connected to face mask oxygen  Post-op Assessment: Report given to RN and Post -op Vital signs reviewed and stable  Post vital signs: Reviewed and stable  Last Vitals:  Vitals:   10/17/15 0720  BP: 123/79  Pulse: 75  Resp: 16  Temp: 36.5 C    Last Pain:  Vitals:   10/17/15 0720  TempSrc: Oral      Patients Stated Pain Goal: 8 (10/17/15 0749)  Complications: No apparent anesthesia complications

## 2015-10-17 NOTE — Discharge Instructions (Signed)
Post Anesthesia Home Care Instructions  Activity: Get plenty of rest for the remainder of the day. A responsible adult should stay with you for 24 hours following the procedure.  For the next 24 hours, DO NOT: -Drive a car -Advertising copywriterperate machinery -Drink alcoholic beverages -Take any medication unless instructed by your physician -Make any legal decisions or sign important papers.  Meals: Start with liquid foods such as gelatin or soup. Progress to regular foods as tolerated. Avoid greasy, spicy, heavy foods. If nausea and/or vomiting occur, drink only clear liquids until the nausea and/or vomiting subsides. Call your physician if vomiting continues.  Special Instructions/Symptoms: Your throat may feel dry or sore from the anesthesia or the breathing tube placed in your throat during surgery. If this causes discomfort, gargle with warm salt water. The discomfort should disappear within 24 hours.  If you had a scopolamine patch placed behind your ear for the management of post- operative nausea and/or vomiting:  1. The medication in the patch is effective for 72 hours, after which it should be removed.  Wrap patch in a tissue and discard in the trash. Wash hands thoroughly with soap and water. 2. You may remove the patch earlier than 72 hours if you experience unpleasant side effects which may include dry mouth, dizziness or visual disturbances. 3. Avoid touching the patch. Wash your hands with soap and water after contact with the patch.    Post Anesthesia Home Care Instructions  Activity: Get plenty of rest for the remainder of the day. A responsible adult should stay with you for 24 hours following the procedure.  For the next 24 hours, DO NOT: -Drive a car -Advertising copywriterperate machinery -Drink alcoholic beverages -Take any medication unless instructed by your physician -Make any legal decisions or sign important papers.  Meals: Start with liquid foods such as gelatin or soup. Progress to  regular foods as tolerated. Avoid greasy, spicy, heavy foods. If nausea and/or vomiting occur, drink only clear liquids until the nausea and/or vomiting subsides. Call your physician if vomiting continues.  Special Instructions/Symptoms: Your throat may feel dry or sore from the anesthesia or the breathing tube placed in your throat during surgery. If this causes discomfort, gargle with warm salt water. The discomfort should disappear within 24 hours.  If you had a scopolamine patch placed behind your ear for the management of post- operative nausea and/or vomiting:  1. The medication in the patch is effective for 72 hours, after which it should be removed.  Wrap patch in a tissue and discard in the trash. Wash hands thoroughly with soap and water. 2. You may remove the patch earlier than 72 hours if you experience unpleasant side effects which may include dry mouth, dizziness or visual disturbances. 3. Avoid touching the patch. Wash your hands with soap and water after contact with the patch.   HOME CARE INSTRUCTIONS - LAPAROSCOPY  Wound Care: The bandaids or dressing which are placed over the skin openings may be removed the day after surgery. The incision should be kept clean and dry. The stitches do not need to be removed. Should the incision become sore, red, and swollen after the first week, check with your doctor.  Personal Hygiene: Shower the day after your procedure. Always wipe from front to back after elimination.   Activity: Do not drive or operate any equipment today. The effects of the anesthesia are still present and drowsiness may result. Rest today, not necessarily flat bed rest, just take it easy. You  may resume your normal activity in one to three days or as instructed by your physician.  Sexual Activity: You resume sexual activity as indicated by your physician_________. If your laparoscopy was for a sterilization ( tubes tied ), continue current method of birth control until  after your next period or ask for specific instructions from your doctor.  Diet: Eat a light diet as desired this evening. You may resume a regular diet tomorrow.  Return to Work: Two to three days or as indicated by your doctor.  Expectations After Surgery: Your surgery will cause vaginal drainage or spotting which may continue for 2-3 days. Mild abdominal discomfort or tenderness is not unusual and some shoulder pain may also be noted which can be relieved by lying flat in pain.  Call Your Doctor If these Occur:  Persistent or heavy bleeding at incision site       Redness or swelling around incision       Elevation of temperature greater than 100 degrees F  Call for follow-up appointment _____________.

## 2015-10-18 ENCOUNTER — Encounter (HOSPITAL_BASED_OUTPATIENT_CLINIC_OR_DEPARTMENT_OTHER): Payer: Self-pay | Admitting: General Surgery

## 2015-11-21 DIAGNOSIS — L089 Local infection of the skin and subcutaneous tissue, unspecified: Secondary | ICD-10-CM | POA: Diagnosis not present

## 2015-12-17 DIAGNOSIS — Z23 Encounter for immunization: Secondary | ICD-10-CM | POA: Diagnosis not present

## 2016-04-10 DIAGNOSIS — R109 Unspecified abdominal pain: Secondary | ICD-10-CM | POA: Diagnosis not present

## 2016-06-11 DIAGNOSIS — Z0000 Encounter for general adult medical examination without abnormal findings: Secondary | ICD-10-CM | POA: Diagnosis not present

## 2016-06-11 DIAGNOSIS — E785 Hyperlipidemia, unspecified: Secondary | ICD-10-CM | POA: Diagnosis not present

## 2016-06-11 DIAGNOSIS — Z125 Encounter for screening for malignant neoplasm of prostate: Secondary | ICD-10-CM | POA: Diagnosis not present

## 2016-08-07 DIAGNOSIS — H00019 Hordeolum externum unspecified eye, unspecified eyelid: Secondary | ICD-10-CM | POA: Diagnosis not present

## 2016-08-18 DIAGNOSIS — H00021 Hordeolum internum right upper eyelid: Secondary | ICD-10-CM | POA: Diagnosis not present

## 2016-09-12 DIAGNOSIS — H00021 Hordeolum internum right upper eyelid: Secondary | ICD-10-CM | POA: Diagnosis not present

## 2016-09-12 DIAGNOSIS — D3131 Benign neoplasm of right choroid: Secondary | ICD-10-CM | POA: Diagnosis not present

## 2016-12-16 DIAGNOSIS — D3131 Benign neoplasm of right choroid: Secondary | ICD-10-CM | POA: Diagnosis not present

## 2017-01-22 DIAGNOSIS — D3131 Benign neoplasm of right choroid: Secondary | ICD-10-CM | POA: Diagnosis not present

## 2017-02-25 ENCOUNTER — Telehealth: Payer: Self-pay | Admitting: *Deleted

## 2017-02-25 DIAGNOSIS — I712 Thoracic aortic aneurysm, without rupture: Secondary | ICD-10-CM

## 2017-02-25 DIAGNOSIS — I7121 Aneurysm of the ascending aorta, without rupture: Secondary | ICD-10-CM

## 2017-02-25 DIAGNOSIS — Z01818 Encounter for other preprocedural examination: Secondary | ICD-10-CM

## 2017-02-25 NOTE — Telephone Encounter (Signed)
Left message to call back -- it is time to set up 2  YEAR F/U OF CT SCAN  ASCENDING AORTIC ANEURYSM AFTER 03/23/17 AND BEFORE 3/27 19 OFFICE APPOINTMENT - PATIENT WILL NEED TO HAVE BMP DONE PRIOR TO CT SCAN.

## 2017-02-25 NOTE — Telephone Encounter (Addendum)
SPOKE TO PATIENT . PATIENT AWARE CT SCAN IS NEEDED Mailed labslip -bmp few days prior to scan and appointment reschedule for first appointment.

## 2017-03-27 DIAGNOSIS — I712 Thoracic aortic aneurysm, without rupture: Secondary | ICD-10-CM | POA: Diagnosis not present

## 2017-03-27 DIAGNOSIS — Z01818 Encounter for other preprocedural examination: Secondary | ICD-10-CM | POA: Diagnosis not present

## 2017-03-28 LAB — BASIC METABOLIC PANEL
BUN / CREAT RATIO: 16 (ref 9–20)
BUN: 16 mg/dL (ref 6–24)
CALCIUM: 9.4 mg/dL (ref 8.7–10.2)
CO2: 24 mmol/L (ref 20–29)
CREATININE: 0.98 mg/dL (ref 0.76–1.27)
Chloride: 103 mmol/L (ref 96–106)
GFR calc non Af Amer: 91 mL/min/{1.73_m2} (ref >59)
GFR, EST AFRICAN AMERICAN: 105 mL/min/{1.73_m2} (ref >59)
Glucose: 77 mg/dL (ref 65–99)
Potassium: 4.1 mmol/L (ref 3.5–5.2)
Sodium: 143 mmol/L (ref 134–144)

## 2017-03-31 ENCOUNTER — Ambulatory Visit (INDEPENDENT_AMBULATORY_CARE_PROVIDER_SITE_OTHER)
Admission: RE | Admit: 2017-03-31 | Discharge: 2017-03-31 | Disposition: A | Discharge status: Home / Self Care | Payer: BLUE CROSS/BLUE SHIELD | Source: Ambulatory Visit | Attending: Cardiology | Admitting: Cardiology

## 2017-03-31 DIAGNOSIS — I712 Thoracic aortic aneurysm, without rupture: Secondary | ICD-10-CM

## 2017-03-31 DIAGNOSIS — R002 Palpitations: Secondary | ICD-10-CM | POA: Diagnosis not present

## 2017-03-31 DIAGNOSIS — I7121 Aneurysm of the ascending aorta, without rupture: Secondary | ICD-10-CM

## 2017-03-31 MED ORDER — IOPAMIDOL (ISOVUE-370) INJECTION 76%
100.0000 mL | Freq: Once | INTRAVENOUS | Status: AC | PRN
  Administered 2017-03-31: 100 mL via INTRAVENOUS

## 2017-04-15 ENCOUNTER — Ambulatory Visit: Payer: BLUE CROSS/BLUE SHIELD | Admitting: Cardiology

## 2017-04-15 ENCOUNTER — Encounter: Payer: Self-pay | Admitting: Cardiology

## 2017-04-15 VITALS — BP 102/70 | Ht 73.0 in | Wt 208.6 lb

## 2017-04-15 DIAGNOSIS — E785 Hyperlipidemia, unspecified: Secondary | ICD-10-CM

## 2017-04-15 DIAGNOSIS — I712 Thoracic aortic aneurysm, without rupture, unspecified: Secondary | ICD-10-CM

## 2017-04-15 DIAGNOSIS — R599 Enlarged lymph nodes, unspecified: Secondary | ICD-10-CM

## 2017-04-15 DIAGNOSIS — Z01818 Encounter for other preprocedural examination: Secondary | ICD-10-CM

## 2017-04-15 DIAGNOSIS — I7121 Aneurysm of the ascending aorta, without rupture: Secondary | ICD-10-CM

## 2017-04-15 NOTE — Progress Notes (Signed)
PCP: Farris HasMorrow, Aaron, MD  Clinic Note: Chief Complaint  Patient presents with  . 2 year follow up    no chest pain , no swelling , no s.o.b  . Thoracic Aortic Aneurysm    stable - f/u post CTA    HPI: Charles Skinner is a 47 y.o. male with a PMH below who presents today for 2 yr f/u for Ascending Aortic Aneurysm. CTA of the chest for PE back in 2014 that suggested a 4.2 cm dilated ascending aorta. Repeat aortic CTA in 2016 & 2017 showed a relatively stable ascending aorta at 4.1 cm.  Charles Skinner was last seen in April of 2017 for 1 yr f/u & now presents for 2 yr.    Recent Hospitalizations: n/a  Studies Personally Reviewed - (if available, images/films reviewed: From Epic Chart or Care Everywhere)  CTA Aortogram (Thoracic): Stable 4.1 cm ascending aortic aneurysm without complicating features. No significant atheromatous plaque, dissection or stenosis. Bovine variant brachiocephalic arterial origin anatomy without proximal stenosis. Proximal visualized abdominal aorta unremarkable.  New enlarged right paratracheal lymph node, possibly reactive but nonspecific. In the absence of a history of primary carcinoma or lymphoma, recommend attention on follow-up imaging as above.  Interval History: Charles Skinner returns here today for his 2-year follow-up feeling quite well.  He only notes occasionally having some times where he feels his heart rate gets up a little bit faster than usual.  May be on occasion it will run in the low 100s at resting heart rate, but otherwise he is totally asymptomatic from a cardiac standpoint.  No chest pain or shortness of breath with rest or exertion.  No PND, orthopnea or edema.  No palpitations, lightheadedness, dizziness, weakness or syncope/near syncope. No TIA/amaurosis fugax symptoms. No melena, hematochezia, hematuria, or epstaxis. No claudication.  ROS:  Review of Systems  Constitutional: Negative for fever and malaise/fatigue.  Respiratory: Negative for  cough, shortness of breath and wheezing.    I have reviewed and (if needed) personally updated the patient's problem list, medications, allergies, past medical and surgical history, social and family history.   Past Medical History:  Diagnosis Date  . Ascending aortic aneurysm (HCC) 02/2012   per last CTA 03-23-2015 --  TAA  4.1cm   (monitored by dr Herbie Baltimoreharding (cardiologist)  . Bilateral inguinal hernia   . History of Helicobacter pylori infection    2014-- resolved w/ antibiotics  . History of palpitations    2014--  resolved  . Mild obstructive sleep apnea    PER PT NO CPAP RECOMMENDATION    Past Surgical History:  Procedure Laterality Date  . ANTERIOR CRUCIATE LIGAMENT REPAIR Left 1996  . INGUINAL HERNIA REPAIR N/A 10/17/2015   Procedure: LAPAROSCOPIC BILATERAL INGUINAL HERNIA REPAIR;  Surgeon: De BlanchLuke Aaron Kinsinger, MD;  [facility];  Service: General;  Laterality: N/A;  . INSERTION OF MESH Bilateral 10/17/2015   Procedure: INSERTION OF MESH;  Surgeon: Rodman PickleLuke Aaron Kinsinger, MD;  [facility];  Service: General;  Laterality: Bilateral;    Current Meds  Medication Sig  . Cholecalciferol (VITAMIN D) 2000 UNITS CAPS Take 1 capsule by mouth daily as needed.   . Flaxseed, Linseed, (FLAXSEED OIL) 1000 MG CAPS Take 1 capsule by mouth daily as needed.   . vitamin C (ASCORBIC ACID) 500 MG tablet Take 500 mg by mouth daily as needed.     No Known Allergies  Social History   Tobacco Use  . Smoking status: Never  Smoker  . Smokeless tobacco: Never Used  Substance Use Topics  . Alcohol use: No  . Drug use: No   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    family history includes Heart attack (age of onset: 12) in his mother.  Wt Readings from Last 3 Encounters:  04/15/17 208 lb 9.6 oz (94.6 kg)  10/17/15 202 lb (91.6  kg)  04/24/15 201 lb (91.2 kg)    PHYSICAL EXAM BP 102/70   Ht 6\' 1"  (1.854 m)   Wt 208 lb 9.6 oz (94.6 kg)   BMI 27.52 kg/m  Physical Exam  Constitutional: He is oriented to person, place, and time. He appears well-developed and well-nourished. No distress.  HENT:  Head: Normocephalic and atraumatic.  Eyes: EOM are normal.  Neck: No hepatojugular reflux and no JVD present. Carotid bruit is not present.  Cardiovascular: Normal rate, regular rhythm, normal heart sounds and intact distal pulses.  No extrasystoles are present. PMI is not displaced. Exam reveals no gallop and no friction rub.  No murmur heard. Pulmonary/Chest: Effort normal and breath sounds normal. No respiratory distress. He has no wheezes.  Musculoskeletal: Normal range of motion. He exhibits no edema.  Neurological: He is alert and oriented to person, place, and time.  Psychiatric: He has a normal mood and affect. His behavior is normal. Judgment and thought content normal.  Nursing note and vitals reviewed.    Adult ECG Report  Rate: 76 ;  Rhythm: normal sinus rhythm and Normal axis, intervals and durations.;   Narrative Interpretation: Normal EKG   Other studies Reviewed: Additional studies/ records that were reviewed today include:  Recent Labs:    Lab Results  Component Value Date   CREATININE 0.98 03/27/2017   BUN 16 03/27/2017   NA 143 03/27/2017   K 4.1 03/27/2017   CL 103 03/27/2017   CO2 24 03/27/2017   Lab Results  Component Value Date   CHOL 171  06/11/2016   HDL 37 06/11/2016   LDLCALC 117 (H) 06/11/2016   TRIG 85 06/11/2016    ASSESSMENT / PLAN: Problem List Items Addressed This Visit    Thoracic aortic aneurysm without rupture (HCC) - ~4.1-4.2 cm - Primary (Chronic)    Once again stable dilated thoracic aorta at roughly 4.1 cm.  No comment on truncus abnormality at this time. There was no suggestion of a enlarged lymph node.  Therefore we will recheck another CT scan in a year.    With the size not change at that time, probably would wait 3 years.      Relevant Orders   CT ANGIO CHEST AORTA W &/OR WO CONTRAST   Pre-op testing   Relevant Orders   Basic metabolic panel   Enlarged lymph node    Enlarged lymph node noted on chest CT. since we are going to need to do an annual follow-up imaging study, we will go ahead and just do CT angiogram to reassess his thoracic aorta as well. Plan: Recheck CT angiogram next year.       Relevant Orders   CT ANGIO CHEST AORTA W &/OR WO CONTRAST   Basic metabolic panel   Dyslipidemia, goal LDL below 100 (Chronic)    Not currently on any medications.  Apparently this has been followed by PCP. -->  Pending repeat CT angiogram may need to become more  aggressive.  His LDL is 117 as of last May.          Current medicines are reviewed at length with the patient today. (+/- concerns) n/a The following changes have been made: n/a  Patient Instructions  No change with current medication       Test Will schedule in march 2020 CT OF CHEST AND AORTA  Your physician has requested that you have cardiac CT. Cardiac computed tomography (CT) is a painless test that uses an x-ray machine to take clear, detailed pictures of your heart. For further information please visit [email or web address] Please follow instruction sheet as given.     Your physician wants you to follow-up in 12 MONTHS WITH DR HARDINGYou will receive a reminder letter in the mail two months in advance. If you don't receive a letter, please call our office to schedule the follow-up appointment.    Studies Ordered:   Orders Placed This Encounter  Procedures  . CT ANGIO CHEST AORTA W &/OR WO CONTRAST  . Basic metabolic panel  . EKG 12-Lead      Bryan Lemma, M.D., M.S. Interventional Cardiologist   Pager # [phone] Phone # [phone] [address] [place], [place] [address]   Thank you for choosing Heart[facility] at  [facility]!!

## 2017-04-15 NOTE — Patient Instructions (Addendum)
No change with current medication       Test Will schedule in march 2020 CT OF CHEST AND AORTA  Your physician has requested that you have cardiac CT. Cardiac computed tomography (CT) is a painless test that uses an x-ray machine to take clear, detailed pictures of your heart. For further information please visit [email or web address] Please follow instruction sheet as given.     Your physician wants you to follow-up in 12 MONTHS WITH DR HARDINGYou will receive a reminder letter in the mail two months in advance. If you don't receive a letter, please call our office to schedule the follow-up appointment.

## 2017-04-17 ENCOUNTER — Encounter: Payer: Self-pay | Admitting: Cardiology

## 2017-04-17 DIAGNOSIS — E785 Hyperlipidemia, unspecified: Secondary | ICD-10-CM | POA: Insufficient documentation

## 2017-04-17 NOTE — Assessment & Plan Note (Signed)
Enlarged lymph node noted on chest CT. since we are going to need to do an annual follow-up imaging study, we will go ahead and just do CT angiogram to reassess his thoracic aorta as well. Plan: Recheck CT angiogram next year.

## 2017-04-17 NOTE — Assessment & Plan Note (Signed)
Not currently on any medications.  Apparently this has been followed by PCP. -->  Pending repeat CT angiogram may need to become more aggressive.  His LDL is 117 as of last May.

## 2017-04-17 NOTE — Assessment & Plan Note (Signed)
Once again stable dilated thoracic aorta at roughly 4.1 cm.  No comment on truncus abnormality at this time. There was no suggestion of a enlarged lymph node.  Therefore we will recheck another CT scan in a year.  With the size not change at that time, probably would wait 3 years.

## 2017-07-17 DIAGNOSIS — Z13220 Encounter for screening for lipoid disorders: Secondary | ICD-10-CM | POA: Diagnosis not present

## 2017-07-17 DIAGNOSIS — Z0000 Encounter for general adult medical examination without abnormal findings: Secondary | ICD-10-CM | POA: Diagnosis not present

## 2017-07-17 DIAGNOSIS — M7712 Lateral epicondylitis, left elbow: Secondary | ICD-10-CM | POA: Diagnosis not present

## 2017-10-30 DIAGNOSIS — Z23 Encounter for immunization: Secondary | ICD-10-CM | POA: Diagnosis not present

## 2017-10-30 DIAGNOSIS — K1379 Other lesions of oral mucosa: Secondary | ICD-10-CM | POA: Diagnosis not present

## 2017-11-04 DIAGNOSIS — K1121 Acute sialoadenitis: Secondary | ICD-10-CM | POA: Diagnosis not present

## 2017-11-04 DIAGNOSIS — K116 Mucocele of salivary gland: Secondary | ICD-10-CM | POA: Diagnosis not present

## 2017-12-04 DIAGNOSIS — K116 Mucocele of salivary gland: Secondary | ICD-10-CM | POA: Diagnosis not present

## 2017-12-04 DIAGNOSIS — K1121 Acute sialoadenitis: Secondary | ICD-10-CM | POA: Diagnosis not present

## 2017-12-09 DIAGNOSIS — K1123 Chronic sialoadenitis: Secondary | ICD-10-CM | POA: Diagnosis not present

## 2018-02-02 DIAGNOSIS — D3131 Benign neoplasm of right choroid: Secondary | ICD-10-CM | POA: Diagnosis not present

## 2018-02-04 DIAGNOSIS — D3131 Benign neoplasm of right choroid: Secondary | ICD-10-CM | POA: Diagnosis not present

## 2018-02-18 DIAGNOSIS — R1033 Periumbilical pain: Secondary | ICD-10-CM | POA: Diagnosis not present

## 2018-03-08 ENCOUNTER — Telehealth: Payer: Self-pay | Admitting: *Deleted

## 2018-03-08 NOTE — Telephone Encounter (Signed)
Patient called and scheduled appointment.

## 2018-04-14 DIAGNOSIS — R238 Other skin changes: Secondary | ICD-10-CM | POA: Diagnosis not present

## 2018-04-14 DIAGNOSIS — R0602 Shortness of breath: Secondary | ICD-10-CM | POA: Diagnosis not present

## 2018-04-14 DIAGNOSIS — I77810 Thoracic aortic ectasia: Secondary | ICD-10-CM | POA: Diagnosis not present

## 2018-04-15 ENCOUNTER — Ambulatory Visit: Payer: BLUE CROSS/BLUE SHIELD | Admitting: Cardiology

## 2018-04-16 ENCOUNTER — Other Ambulatory Visit: Payer: Self-pay

## 2018-04-16 ENCOUNTER — Encounter (INDEPENDENT_AMBULATORY_CARE_PROVIDER_SITE_OTHER): Payer: Self-pay

## 2018-04-16 ENCOUNTER — Ambulatory Visit (INDEPENDENT_AMBULATORY_CARE_PROVIDER_SITE_OTHER)
Admission: RE | Admit: 2018-04-16 | Discharge: 2018-04-16 | Disposition: A | Discharge status: Home / Self Care | Payer: BLUE CROSS/BLUE SHIELD | Source: Ambulatory Visit | Attending: Cardiology | Admitting: Cardiology

## 2018-04-16 DIAGNOSIS — I712 Thoracic aortic aneurysm, without rupture, unspecified: Secondary | ICD-10-CM

## 2018-04-16 DIAGNOSIS — R599 Enlarged lymph nodes, unspecified: Secondary | ICD-10-CM

## 2018-04-16 DIAGNOSIS — I7121 Aneurysm of the ascending aorta, without rupture: Secondary | ICD-10-CM

## 2018-04-16 MED ORDER — IOHEXOL 350 MG/ML SOLN
100.0000 mL | Freq: Once | INTRAVENOUS | Status: AC | PRN
  Administered 2018-04-16: 100 mL via INTRAVENOUS

## 2018-04-26 ENCOUNTER — Telehealth: Payer: Self-pay | Admitting: *Deleted

## 2018-04-26 NOTE — Telephone Encounter (Signed)
LEFT MESSAGE TO CALL BACK - NEED SCHEDULE  E- VISIT ( VIDEO, [email or web address]) FOR 4/13 [place]

## 2018-04-27 NOTE — Telephone Encounter (Signed)
Follow up  ° ° °Patient is returning call.  °

## 2018-04-27 NOTE — Telephone Encounter (Signed)
   TELEPHONE CALL NOTE  This patient has been deemed a candidate for follow-up tele-health visit to limit community exposure during the Covid-19 pandemic. I spoke with the patient via phone to discuss instructions. This has been outlined on the patient's AVS (dotphrase: [email or web address]). The patient was advised to review the section on consent for treatment as well. The patient will receive a phone call 2-3 days prior to their E-Visit at which time consent will be verbally confirmed. A Virtual Office Visit appointment type has been scheduled for 4/13/ AT 8 AM with  DR HARDING, - patient prefers VIDEO /DOXY  type.  I have GIVEN PATIENT INFORMATION TO SIGN UP FOR MYCHART - WILL SEND E-VISIT INFORMATION ONCE COMPLETED.either confirmed the patient is active Tobin Chad, RN 04/27/2018 2:35 PM

## 2018-04-27 NOTE — Telephone Encounter (Signed)
I attempted to contact patient back to switch over appointment.  Patient did not answer, LVM to call back.  Will route back to nurse to make her aware.  Thanks!

## 2018-04-30 ENCOUNTER — Telehealth: Payer: Self-pay | Admitting: Cardiology

## 2018-04-30 NOTE — Telephone Encounter (Signed)
lvm for pre reg  °

## 2018-05-03 ENCOUNTER — Encounter: Payer: Self-pay | Admitting: Cardiology

## 2018-05-03 ENCOUNTER — Telehealth (INDEPENDENT_AMBULATORY_CARE_PROVIDER_SITE_OTHER): Payer: BLUE CROSS/BLUE SHIELD | Admitting: Cardiology

## 2018-05-03 ENCOUNTER — Telehealth: Payer: Self-pay | Admitting: *Deleted

## 2018-05-03 VITALS — Ht 73.0 in | Wt 208.0 lb

## 2018-05-03 DIAGNOSIS — E785 Hyperlipidemia, unspecified: Secondary | ICD-10-CM

## 2018-05-03 DIAGNOSIS — R599 Enlarged lymph nodes, unspecified: Secondary | ICD-10-CM

## 2018-05-03 DIAGNOSIS — I712 Thoracic aortic aneurysm, without rupture, unspecified: Secondary | ICD-10-CM

## 2018-05-03 NOTE — Progress Notes (Signed)
Virtual Visit via Video Note   This visit type was conducted due to national recommendations for restrictions regarding the COVID-19 Pandemic (e.g. social distancing) in an effort to limit this patient's exposure and mitigate transmission in our community.  Due to his co-morbid illnesses, this patient is at least at moderate risk for complications without adequate follow up.  This format is felt to be most appropriate for this patient at this time.  All issues noted in this document were discussed and addressed.  A limited physical exam was performed with this format.  Please refer to the patient's chart for his consent to telehealth for [facility].   Patient has given verbal permission to conduct this visit via virtual appointment and to bill insurance 05/03/2018 0750 AM.  Evaluation Performed:  Follow-up visit  Date:  05/03/2018   ID:  Charles Skinner, Charles Skinner [birth year], MRN [id number]  Patient Location: Home  Provider Location: Home  PCP:  Farris Has, MD  Cardiologist:  Bryan Lemma, MD Bryan Lemma, MD Electrophysiologist:  None   Chief Complaint:  Annual f/u for Ascending Aortic Anuerysm  History of Present Illness:    Charles Skinner is a 48 y.o. male who presents via audio/video conferencing for a telehealth visit today.    CTA of the chest for PE back in 2014 that suggested a 4.2 cm dilated ascending aorta. Repeat aortic CTA in 2016-2017 & 2019 showed a relatively stable ascending aorta at 4.1 cm  Sal was last seen in March 2019 after CTA results. Was doing well.   INTERVAL HISTORY:  Doing well.  Off & on twinge lasting 30-60 min- ? muscle strain on upper L chest /shoulder.  More surface than deep. Made worse with movement - not exertion. Very rare.  Went to PCP recently - noted some bruising off & on - not sure where they come from .  May be breathing heavier than usual @ work.  Also only occasionally.  Not associated with real exertional.    Cardiovascular ROS: no chest  pain or dyspnea on exertion positive for - symptoms noted above negative for - edema, irregular heartbeat, orthopnea, palpitations, paroxysmal nocturnal dyspnea, rapid heart rate, shortness of breath or syncope / near syncope, TIA /amaurosis fugax  ROS:  Please see the history of present illness.    Review of Systems  Constitutional: Negative for malaise/fatigue and weight loss.  HENT: Negative for congestion.   Respiratory: Negative for cough and wheezing.   Gastrointestinal: Negative for blood in stool and melena.  Genitourinary: Negative for hematuria.  Musculoskeletal: Positive for joint pain. Negative for myalgias.  Neurological: Negative for dizziness and focal weakness.  Psychiatric/Behavioral: Negative.   All other systems reviewed and are negative.  The patient does not have symptoms concerning for COVID-19 infection (fever, chills, cough, or new shortness of breath).    Past Medical History:  Diagnosis Date   Ascending aortic aneurysm (HCC) 02/2012   per last CTA 03-2017 --  TAA  4.3cm   (monitored by dr Korde Jeppsen (cardiologist)   Bilateral inguinal hernia    History of Helicobacter pylori infection    2014-- resolved w/ antibiotics   History of palpitations    2014--  resolved   Mild obstructive sleep apnea    PER PT NO CPAP RECOMMENDATION   Past Surgical History:  Procedure Laterality Date   ANTERIOR CRUCIATE LIGAMENT REPAIR Left 1996   INGUINAL HERNIA REPAIR N/A 10/17/2015   Procedure: LAPAROSCOPIC BILATERAL INGUINAL HERNIA REPAIR;  Surgeon: Franky Macho  Michel Santee, MD;  [facility];  Service: General;  Laterality: N/A;   INSERTION OF MESH Bilateral 10/17/2015   Procedure: INSERTION OF MESH;  Surgeon: Rodman Pickle, MD;  Location: [facility];  Service: General;  Laterality: Bilateral;     Current Meds  Medication Sig   Flaxseed, Linseed, (FLAXSEED OIL) 1200 MG CAPS Take 1,200 capsules by mouth daily.   vitamin  B-12 (CYANOCOBALAMIN) 500 MCG tablet Take 500 mcg by mouth daily as needed.   vitamin C (ASCORBIC ACID) 500 MG tablet Take 500 mg by mouth daily as needed.    Vitamin D, Ergocalciferol, (DRISDOL) 1.25 MG (50000 UT) CAPS capsule Take 5,000 Units by mouth daily.     Allergies:   Patient has no known allergies.   Social History   Tobacco Use   Smoking status: Never Smoker   Smokeless tobacco: Never Used  Substance Use Topics   Alcohol use: No   Drug use: No     Family Hx: The patient's family history includes Heart attack (age of onset: 31) in his mother.   Prior CV studies:   The following studies were reviewed today:  Chest CTA 04/16/2018: Mild in general enlargement of the ascending thoracic aortic aneurysm now measuring 4.3 as opposed to 4.1 cm bovine variant brachiocephalic arterial origin).  Continue annual follow-up.  Slight interval decrease in mildly enlarged right paratracheal lymph node.  Has been present since 2016.  Not significantly changed.  Labs/Other Tests and Data Reviewed:    EKG:  No ECG reviewed.  Recent Labs: No results found for requested labs within last 8760 hours.   Recent Lipid Panel - PCP follows - Last May. - Due this May July 17, 2017: TC 201, TG 78,  HDL 32 , LDL 144 (non HDL 217)  Wt Readings from Last 3 Encounters:  05/03/18 208 lb (94.3 kg)  04/15/17 208 lb 9.6 oz (94.6 kg)  10/17/15 202 lb (91.6 kg)    Objective:    Vital Signs:  Ht 6\' 1"  (1.854 m)    Wt 208 lb (94.3 kg)    BMI 27.44 kg/m   Well nourished, well developed male in no acute distress. Well groomed.    A&O x 3.  Normal mood & affect  Non-labored breathing.  ASSESSMENT & PLAN:    Problem List Items Addressed This Visit    Dyslipidemia, goal LDL below 100 - Primary (Chronic)    LDL and total exertional seem to take the turn in the wrong direction as of last year.  He is due to have labs checked in a month or 2.  Hopefully these levels with improved, if not, would  probably recommend initiation of statin therapy with probably starting rosuvastatin 20 to 40 mg.       Enlarged lymph node    Stable on current CT.  Apparently this is been there since 2016.  Continue to monitor.      Thoracic aortic aneurysm without rupture (HCC) - ~4.1-4.2 cm (Chronic)    Slight increase in diameter from last check-up from 4.1 to 4.3 cm. Lymph node seems stable. With increase in diameter and also lipids that are not as well-controlled, we will recheck CT scan next March-April timeframe.  If stable at that time can probably wait 2 years.  Recommendations is continued blood pressure management which seems to be well controlled on his part.  But the one port is not as well-controlled as his lipids. -Discussed importance of  management.  Is due for a check in a couple months -there is still targeting LDL less than 100.      Relevant Orders   CT ANGIO CHEST AORTA W &/OR WO CONTRAST      COVID-19 Education: The signs and symptoms of COVID-19 were discussed with the patient and how to seek care for testing (follow up with PCP or arrange E-visit).  The importance of social distancing was discussed today.  Time:   Today, I have spent 15 minutes with the patient with telehealth technology discussing the above problems.     Medication Adjustments/Labs and Tests Ordered: Current medicines are reviewed at length with the patient today.  Concerns regarding medicines are outlined above.  Tests Ordered: Orders Placed This Encounter  Procedures   CT ANGIO CHEST AORTA W &/OR WO CONTRAST   Medication Changes: No orders of the defined types were placed in this encounter.   Disposition:  Follow up 1 year  Signed, Bryan Lemmaavid Sawyer Mentzer, MD  05/03/2018 9:23 AM    [facility]

## 2018-05-03 NOTE — Assessment & Plan Note (Signed)
LDL and total exertional seem to take the turn in the wrong direction as of last year.  He is due to have labs checked in a month or 2.  Hopefully these levels with improved, if not, would probably recommend initiation of statin therapy with probably starting rosuvastatin 20 to 40 mg.

## 2018-05-03 NOTE — Telephone Encounter (Signed)
RN WENT OVER AVS INSTRUCTION WITH PATIENT,  AVS WILL BE SENT BY MYCHART. PATIENT VERBALIZED UNDERSTANDING.

## 2018-05-03 NOTE — Assessment & Plan Note (Addendum)
Slight increase in diameter from last check-up from 4.1 to 4.3 cm. Lymph node seems stable. With increase in diameter and also lipids that are not as well-controlled, we will recheck CT scan next March-April timeframe.  If stable at that time can probably wait 2 years.  Recommendations is continued blood pressure management which seems to be well controlled on his part.  But the one port is not as well-controlled as his lipids. -Discussed importance of management.  Is due for a check in a couple months -there is still targeting LDL less than 100.

## 2018-05-03 NOTE — Assessment & Plan Note (Signed)
Stable on current CT.  Apparently this is been there since 2016.  Continue to monitor.

## 2018-05-03 NOTE — Patient Instructions (Addendum)
Medication Instructions:  --No medication change.  If you need a refill on your cardiac medications before your next appointment, please call your pharmacy.   Lab work: -- Labs will be checked by PCP in a month or 2.  Target cholesterol levels would be an LDL less than 100 which would make the total cholesterol less than probably 160-170. ->  If not at goal, would probably need to start cholesterol medicine such as rosuvastatin and either 20 or 40 mg.  If you have labs (blood work) drawn today and your tests are completely normal, you will receive your results only by: Marland Kitchen MyChart Message (if you have MyChart) OR . A paper copy in the mail If you have any lab test that is abnormal or we need to change your treatment, we will call you to review the results.  Testing/Procedures:  CT Angiogram of Chest will be schedule in MARCH 2021, at [facility] [address]- Non-Cardiac CT Angiography (CTA), is a special type of CT scan that uses a computer to produce multi-dimensional views of major blood vessels throughout the body. In CT angiography, a contrast material is injected through an IV to help visualize the blood vessels    Follow-Up: At [facility], you and your health needs are our priority.  As part of our continuing mission to provide you with exceptional heart care, we have created designated Provider Care Teams.  These Care Teams include your primary Cardiologist (physician) and Advanced Practice Providers (APPs -  Physician Assistants and Nurse Practitioners) who all work together to provide you with the care you need, when you need it. You will need a follow up appointment in    April 2021 after CT  ANGIOGRAM OF THE CHEST  AORTA W/ AND W/O CONTRAST .  Please call our office 2 months in advance to schedule this appointment.  You may see Bryan Lemma ,MD or one of the following Advanced Practice Providers on your designated Care Team:   Theodore Demark, PA-C . Joni Reining,  DNP, ANP  Any Other Special Instructions Will Be Listed Below (If Applicable).

## 2018-08-18 DIAGNOSIS — R109 Unspecified abdominal pain: Secondary | ICD-10-CM | POA: Diagnosis not present

## 2018-09-17 DIAGNOSIS — Z1159 Encounter for screening for other viral diseases: Secondary | ICD-10-CM | POA: Diagnosis not present

## 2018-09-22 DIAGNOSIS — K2970 Gastritis, unspecified, without bleeding: Secondary | ICD-10-CM | POA: Diagnosis not present

## 2018-09-22 DIAGNOSIS — R1013 Epigastric pain: Secondary | ICD-10-CM | POA: Diagnosis not present

## 2018-09-22 DIAGNOSIS — K649 Unspecified hemorrhoids: Secondary | ICD-10-CM | POA: Diagnosis not present

## 2018-09-22 DIAGNOSIS — K2930 Chronic superficial gastritis without bleeding: Secondary | ICD-10-CM | POA: Diagnosis not present

## 2018-09-22 DIAGNOSIS — Z1211 Encounter for screening for malignant neoplasm of colon: Secondary | ICD-10-CM | POA: Diagnosis not present

## 2018-10-05 ENCOUNTER — Other Ambulatory Visit: Payer: Self-pay | Admitting: Gastroenterology

## 2018-10-05 DIAGNOSIS — R1011 Right upper quadrant pain: Secondary | ICD-10-CM

## 2018-10-07 ENCOUNTER — Ambulatory Visit
Admission: RE | Admit: 2018-10-07 | Discharge: 2018-10-07 | Disposition: A | Discharge status: Home / Self Care | Payer: BLUE CROSS/BLUE SHIELD | Source: Ambulatory Visit | Attending: Gastroenterology | Admitting: Gastroenterology

## 2018-10-07 DIAGNOSIS — K7689 Other specified diseases of liver: Secondary | ICD-10-CM | POA: Diagnosis not present

## 2018-10-07 DIAGNOSIS — R109 Unspecified abdominal pain: Secondary | ICD-10-CM | POA: Diagnosis not present

## 2018-10-07 DIAGNOSIS — R1011 Right upper quadrant pain: Secondary | ICD-10-CM

## 2019-03-14 ENCOUNTER — Telehealth: Payer: Self-pay | Admitting: *Deleted

## 2019-03-14 NOTE — Telephone Encounter (Signed)
alled left message to call back   also sent a mychart message informing patient - time for annual CT scan and follow up visit

## 2019-03-14 NOTE — Telephone Encounter (Signed)
-----   Message from Tobin Chad, RN sent at 05/03/2018  9:07 AM EDT ----- Regarding: NEED CT ANG OF CHEST AORTA SCHEDULE AT THE END OF MARCH 2021 NO BMP IS NEEDED   WILL SEND MESSAGE @FEB  2021

## 2019-04-19 ENCOUNTER — Inpatient Hospital Stay: Admission: RE | Admit: 2019-04-19 | Payer: BC Managed Care – PPO | Source: Ambulatory Visit

## 2019-05-02 ENCOUNTER — Ambulatory Visit: Payer: BC Managed Care – PPO | Admitting: Cardiology

## 2019-05-17 ENCOUNTER — Other Ambulatory Visit: Payer: Self-pay

## 2019-05-17 ENCOUNTER — Ambulatory Visit (INDEPENDENT_AMBULATORY_CARE_PROVIDER_SITE_OTHER)
Admission: RE | Admit: 2019-05-17 | Discharge: 2019-05-17 | Disposition: A | Discharge status: Home / Self Care | Payer: Self-pay | Source: Ambulatory Visit | Attending: Cardiology | Admitting: Cardiology

## 2019-05-17 DIAGNOSIS — I712 Thoracic aortic aneurysm, without rupture, unspecified: Secondary | ICD-10-CM

## 2019-05-17 MED ORDER — IOHEXOL 350 MG/ML SOLN
100.0000 mL | Freq: Once | INTRAVENOUS | Status: AC | PRN
  Administered 2019-05-17: 100 mL via INTRAVENOUS

## 2019-05-24 ENCOUNTER — Other Ambulatory Visit: Payer: Self-pay

## 2019-05-24 ENCOUNTER — Encounter: Payer: Self-pay | Admitting: Cardiology

## 2019-05-24 ENCOUNTER — Ambulatory Visit (INDEPENDENT_AMBULATORY_CARE_PROVIDER_SITE_OTHER): Payer: Self-pay | Admitting: Cardiology

## 2019-05-24 VITALS — BP 115/80 | HR 81 | Ht 73.0 in | Wt 199.6 lb

## 2019-05-24 DIAGNOSIS — I358 Other nonrheumatic aortic valve disorders: Secondary | ICD-10-CM | POA: Insufficient documentation

## 2019-05-24 DIAGNOSIS — I712 Thoracic aortic aneurysm, without rupture, unspecified: Secondary | ICD-10-CM

## 2019-05-24 DIAGNOSIS — E785 Hyperlipidemia, unspecified: Secondary | ICD-10-CM

## 2019-05-24 NOTE — Patient Instructions (Signed)
Medication Instructions:  No changes *If you need a refill on your cardiac medications before your next appointment, please call your pharmacy*   Lab Work: Not needed   Testing/Procedures: Will be schedule at [facility] [address]- April 2022 Your physician has requested that you have an echocardiogram. Echocardiography is a painless test that uses sound waves to create images of your heart. It provides your doctor with information about the size and shape of your heart and how well your heart's chambers and valves are working. This procedure takes approximately one hour. There are no restrictions for this procedure.     Follow-Up: At [facility], you and your health needs are our priority.  As part of our continuing mission to provide you with exceptional heart care, we have created designated Provider Care Teams.  These Care Teams include your primary Cardiologist (physician) and Advanced Practice Providers (APPs -  Physician Assistants and Nurse Practitioners) who all work together to provide you with the care you need, when you need it.    Your next appointment:   12 month(s) after echo  The format for your next appointment:   In Person  Provider:   Bryan Lemma, MD   Other Instructions n/a

## 2019-05-24 NOTE — Progress Notes (Signed)
Primary Care Provider: Farris Has, MD Cardiologist: Bryan Lemma, MD Electrophysiologist: None  Clinic Note: Chief Complaint  Patient presents with  . Follow-up    Chest CT results    HPI:    Charles Skinner is a 49 y.o. male with a PMH below who presents today for annual follow-up of chest CTA for mild thoracic aortic aneurysm.Charles Skinner was last seen on May 03, 2018 via telemedicine visit.  He had been noted to have a 4.2 cm dilated ascending aorta on chest CT for PE evaluation in 2014.  Stable findings noted in 2016 and 2017 as well as 2019.  When I saw him in 2020 he was doing well via telemedicine.  No major complaints.  Maybe little heavier breathing than usual at work but nothing significant.  He had a left chest/shoulder strain but no significant other symptoms.  Plan was to follow-up CT scan in the spring 2021.  Prior to annual follow-up.  He also had a recheck his lipids.  Recent Hospitalizations: None  Reviewed  CV studies:    The following studies were reviewed today: (if available, images/films reviewed: From Epic Chart or Care Everywhere) .  CTA Chest 05/17/2019: Stable fusiform aneurysm of the ascending thoracic aorta measuring 4.3 cm.  Bovine arch with common origin of the left common carotid and brachiocephalic.  Normal great vessels.  Proximal descending aorta 2.3 cm-stable descending thoracic aortic 2.2 mm, stable.  Normal abdominal aorta celiac trunk proximal SMA and proximal bilateral renal arteries.   Interval History:   Charles Skinner presents here today for follow-up of his CT scan really doing fine with no major issues.  He has not had any untoward symptoms.  No chest pain or pressure.  No signs or symptoms of dyspnea with rest or exertion.  No PND orthopnea.  He does indicate that he had a family history with his father having CABG now few years ago.  He has a little bit about baseline evaluation for this.  CV Review of Symptoms  (Summary) Cardiovascular ROS: no chest pain or dyspnea on exertion negative for - edema, irregular heartbeat, orthopnea, palpitations, paroxysmal nocturnal dyspnea, rapid heart rate, shortness of breath or Syncope/near syncope, TIA/amaurosis fugax, claudication  The patient does not have symptoms concerning for COVID-19 infection (fever, chills, cough, or new shortness of breath).  The patient is practicing social distancing & Masking.    REVIEWED OF SYSTEMS   Review of Systems  Constitutional: Negative for malaise/fatigue and weight loss.  HENT: Negative for nosebleeds.   Respiratory: Negative for shortness of breath.   Gastrointestinal: Negative for blood in stool and melena.  Genitourinary: Negative for hematuria.  Musculoskeletal: Negative for joint pain.  Neurological: Negative.   Psychiatric/Behavioral: Negative.     I have reviewed and (if needed) personally updated the patient's problem list, medications, allergies, past medical and surgical history, social and family history.   PAST MEDICAL HISTORY   Past Medical History:  Diagnosis Date  . Ascending aortic aneurysm (HCC) 02/2012   per last CTA 03-2017 --  TAA  4.3cm   (monitored by dr Adline Kirshenbaum (cardiologist)  . Bilateral inguinal hernia   . History of Helicobacter pylori infection    2014-- resolved w/ antibiotics  . History of palpitations    2014--  resolved  . Mild obstructive sleep apnea    PER PT NO CPAP RECOMMENDATION    PAST SURGICAL HISTORY   Past Surgical History:  Procedure Laterality Date  .  ANTERIOR CRUCIATE LIGAMENT REPAIR Left 1996  . INGUINAL HERNIA REPAIR N/A 10/17/2015   Procedure: LAPAROSCOPIC BILATERAL INGUINAL HERNIA REPAIR;  Surgeon: De Blanch Kinsinger, MD;  Location: [facility];  Service: General;  Laterality: N/A;  . INSERTION OF MESH Bilateral 10/17/2015   Procedure: INSERTION OF MESH;  Surgeon: Rodman Pickle, MD;  Location: [facility];  Service:  General;  Laterality: Bilateral;    MEDICATIONS/ALLERGIES   Current Meds  Medication Sig  . Flaxseed, Linseed, (FLAXSEED OIL) 1200 MG CAPS Take 1,200 capsules by mouth daily.  . vitamin B-12 (CYANOCOBALAMIN) 500 MCG tablet Take 500 mcg by mouth daily as needed. prn  . vitamin C (ASCORBIC ACID) 500 MG tablet Take 500 mg by mouth daily as needed.   . Vitamin D, Ergocalciferol, (DRISDOL) 1.25 MG (50000 UT) CAPS capsule Take 5,000 Units by mouth daily.    No Known Allergies  SOCIAL HISTORY/FAMILY HISTORY   Reviewed in Epic:  Pertinent findings: No change.  OBJCTIVE -PE, EKG, labs   Wt Readings from Last 3 Encounters:  05/24/19 199 lb 9.6 oz (90.5 kg)  05/03/18 208 lb (94.3 kg)  04/15/17 208 lb 9.6 oz (94.6 kg)    Physical Exam: BP 115/80   Pulse 81   Ht 6\' 1"  (1.854 m)   Wt 199 lb 9.6 oz (90.5 kg)   SpO2 97%   BMI 26.33 kg/m  Physical Exam  Constitutional: He is oriented to person, place, and time. He appears well-developed and well-nourished. No distress.  Healthy-appearing.  Well-groomed.  HENT:  Head: Normocephalic and atraumatic.  Neck: No hepatojugular reflux and no JVD present. Carotid bruit is not present (Radiated aortic valve murmur but no bruit).  Cardiovascular: Normal rate, regular rhythm, S1 normal, S2 normal and intact distal pulses.  No extrasystoles are present. PMI is not displaced. Exam reveals no gallop, no distant heart sounds and no friction rub.  Murmur (Soft 1/6 SEM at RUSB.) heard. Pulmonary/Chest: Effort normal and breath sounds normal. No respiratory distress. He has no wheezes. He has no rales.  Musculoskeletal:        General: No edema. Normal range of motion.     Cervical back: Normal range of motion and neck supple.  Neurological: He is alert and oriented to person, place, and time.  Psychiatric: He has a normal mood and affect. His behavior is normal. Judgment and thought content normal.  Vitals reviewed.   Adult ECG Report  Rate: 88 ;   Rhythm: normal sinus rhythm and Left left atrial enlargement.  Otherwise normal axis, intervals and durations.;   Narrative Interpretation: Relative normal EKG.  Recent Labs: No labs available since 2019-should be followed by PCP.    As of June 2019: TC 201, TG 78, HDL 42, LDL 144.  Lab Results  Component Value Date   CREATININE 0.98 03/27/2017   BUN 16 03/27/2017   NA 143 03/27/2017   K 4.1 03/27/2017   CL 103 03/27/2017   CO2 24 03/27/2017   Lab Results  Component Value Date   TSH 2.082 03/06/2012    ASSESSMENT/PLAN    Problem List Items Addressed This Visit    Thoracic aortic aneurysm without rupture (HCC) - ~4.1-4.2 cm - Primary (Chronic)    Pretty much stable findings now for several years.  I think we can probably switch to every 2 years now to check his aorta.  I suspect that this may just be his baseline.  I do hear a systolic murmur and  want to confirm there is no evidence of any aortic valve disease.  We can check an echocardiogram prior to his follow-up visit next year which can also evaluate his ascending aorta.  Provided there is no gross abnormalities, we would probably recheck of CT scan in 2023.  Continue to monitor blood pressure and need to titrate treatment of lipids.  Unfortunate do not have any recent lipid labs to go by.      Relevant Orders   EKG 12-Lead (Completed)   ECHOCARDIOGRAM COMPLETE   Dyslipidemia, goal LDL below 100 (Chronic)    He says his PCP has been checking his labs.  I do think he probably will need to be on a statin if he has not any improvement in his lipid levels from 2019.  Would probably consider rosuvastatin either 20 or 40 mg daily.  Since I am only seeing him annually, and he is due to see his PCP soon, will defer to his PCP.      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  Plan: We will check 2D echo next year prior to his follow-up in  order to assess his murmur but also reassess thoracic aorta.      Relevant Orders   EKG 12-Lead (Completed)   ECHOCARDIOGRAM COMPLETE       COVID-19 Education: The signs and symptoms of COVID-19 were discussed with the patient and how to seek care for testing (follow up with PCP or arrange E-visit).   The importance of social distancing and COVID-19 vaccination was discussed today.  I spent a total of 47minutes with the patient. >  50% of the time was spent in direct patient consultation.  Additional time spent with chart review  / charting (studies, outside notes, etc): 8 Total Time: 26 min   Current medicines are reviewed at length with the patient today.  (+/- concerns) N/A  Notice: This dictation was prepared with Dragon dictation along with smaller phrase technology. Any transcriptional errors that result from this process are unintentional and may not be corrected upon review.  Patient Instructions / Medication Changes & Studies & Tests Ordered   Patient Instructions  Medication Instructions:  No changes *If you need a refill on your cardiac medications before your next appointment, please call your pharmacy*   Lab Work: Not needed   Testing/Procedures: Will be schedule at [facility] [address]- April 2022 Your physician has requested that you have an echocardiogram. Echocardiography is a painless test that uses sound waves to create images of your heart. It provides your doctor with information about the size and shape of your heart and how well your heart's chambers and valves are working. This procedure takes approximately one hour. There are no restrictions for this procedure.     Follow-Up: At [facility], you and your health needs are our priority.  As part of our continuing mission to provide you with exceptional heart care, we have created designated Provider Care Teams.  These Care Teams include your primary Cardiologist (physician) and  Advanced Practice Providers (APPs -  Physician Assistants and Nurse Practitioners) who all work together to provide you with the care you need, when you need it.    Your next appointment:   12 month(s) after echo  The format for your next appointment:   In Person  Provider:   Glenetta Hew, MD   Other Instructions n/a  Studies Ordered:   Orders Placed This Encounter  Procedures  . EKG 12-Lead  . ECHOCARDIOGRAM COMPLETE     Bryan Lemma, M.D., M.S. Interventional Cardiologist    [address] [place], [place] [address]   Thank you for choosing Heartcare at [facility]!!

## 2019-05-30 ENCOUNTER — Encounter: Payer: Self-pay | Admitting: Cardiology

## 2019-05-30 NOTE — Assessment & Plan Note (Signed)
He says his PCP has been checking his labs.  I do think he probably will need to be on a statin if he has not any improvement in his lipid levels from 2019.  Would probably consider rosuvastatin either 20 or 40 mg daily.  Since I am only seeing him annually, and he is due to see his PCP soon, will defer to his PCP.

## 2019-05-30 NOTE — Assessment & Plan Note (Signed)
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  Plan: We will check 2D echo next year prior to his follow-up in order to assess his murmur but also reassess thoracic aorta.

## 2019-05-30 NOTE — Assessment & Plan Note (Signed)
Pretty much stable findings now for several years.  I think we can probably switch to every 2 years now to check his aorta.  I suspect that this may just be his baseline.  I do hear a systolic murmur and want to confirm there is no evidence of any aortic valve disease.  We can check an echocardiogram prior to his follow-up visit next year which can also evaluate his ascending aorta.  Provided there is no gross abnormalities, we would probably recheck of CT scan in 2023.  Continue to monitor blood pressure and need to titrate treatment of lipids.  Unfortunate do not have any recent lipid labs to go by.

## 2019-10-06 IMAGING — US US ABDOMEN LIMITED
1 series · 14 of 25 positions shown · non-contrast
Comparison: Ultrasound 09/05/2013, CT 04/16/2018

CLINICAL DATA: Right upper quadrant pain

EXAM:
ULTRASOUND ABDOMEN LIMITED RIGHT UPPER QUADRANT

[Series 1: us abdomen limited · 0.25mm/px · 14 of 41 slices shown]
[im 1/41]
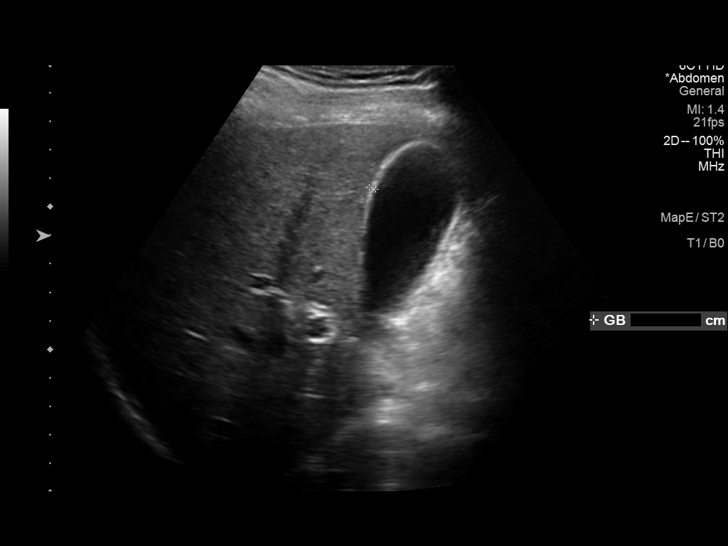
[im 4/41]
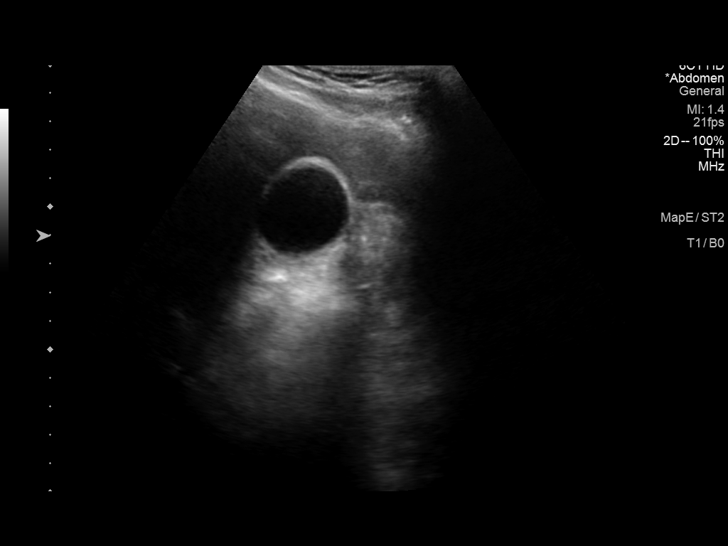
[im 7/41]
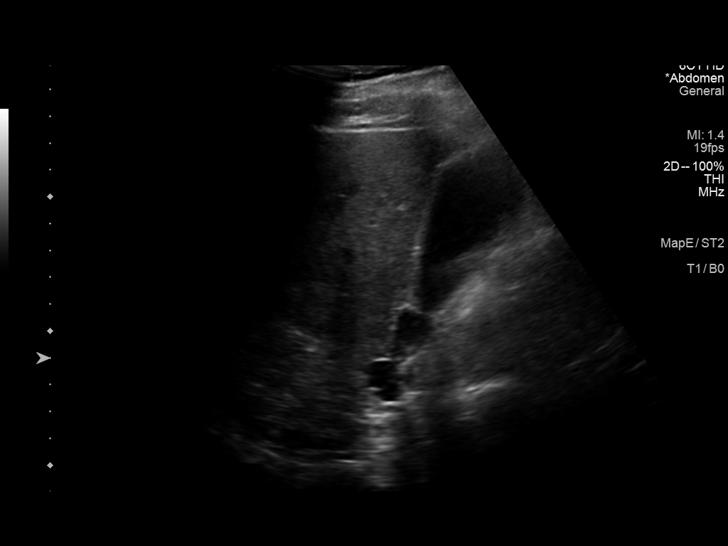
[im 11/41]
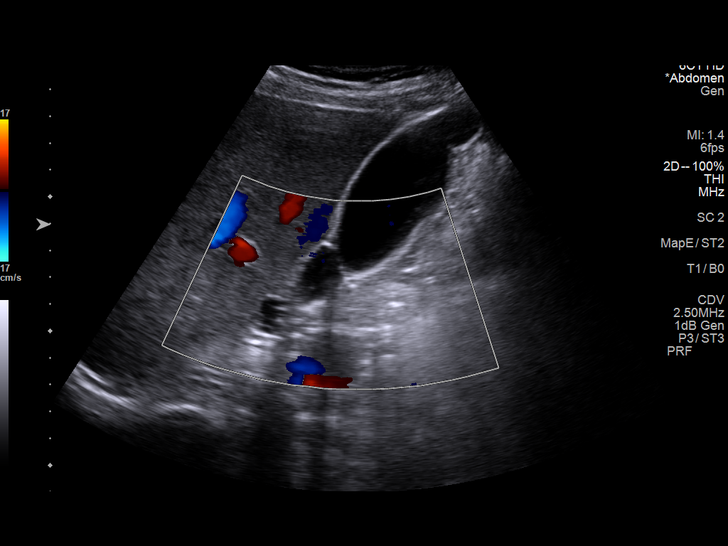
[im 14/41]
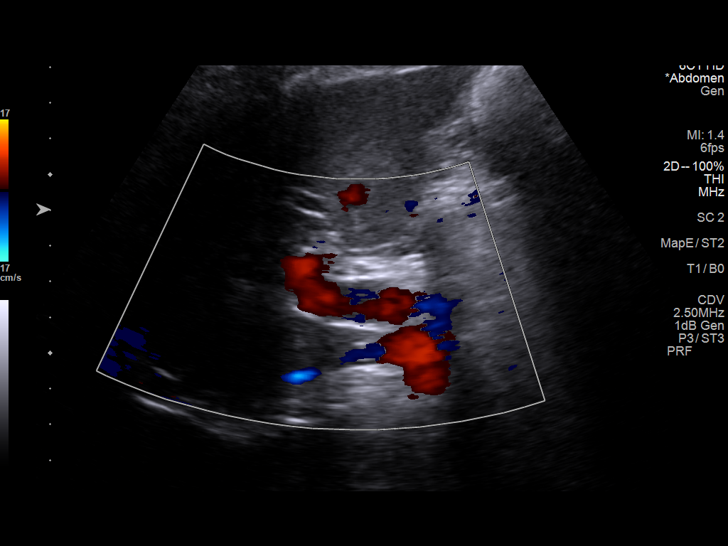
[im 16/41]
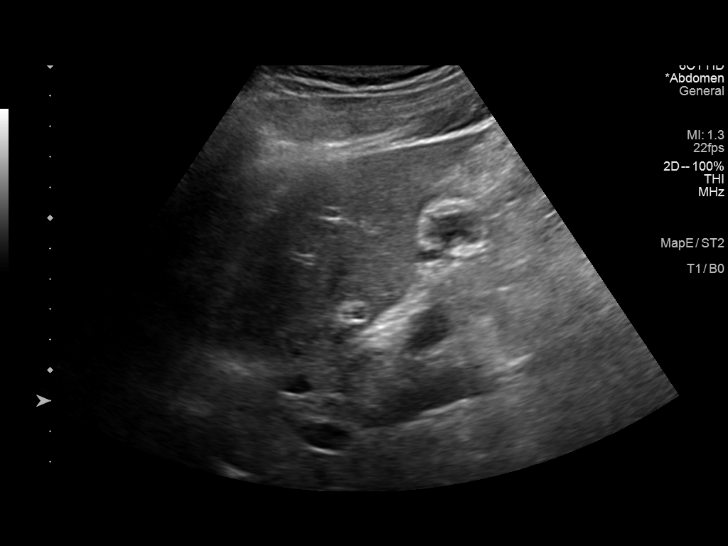
[im 19/41]
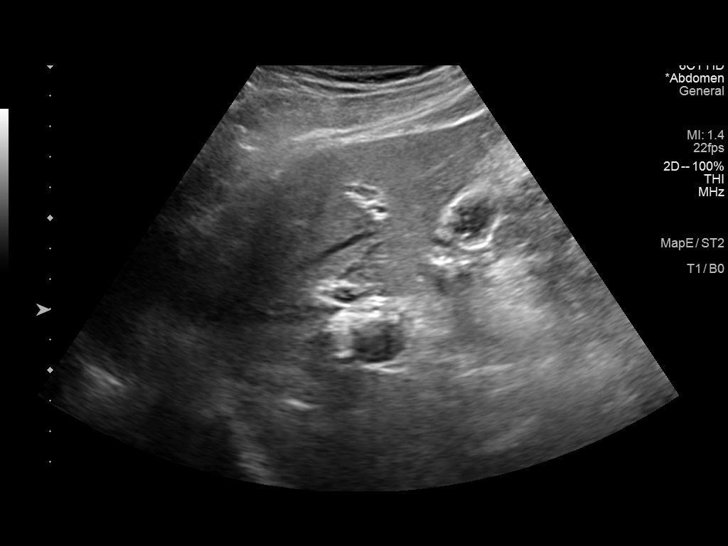
[im 22/41]
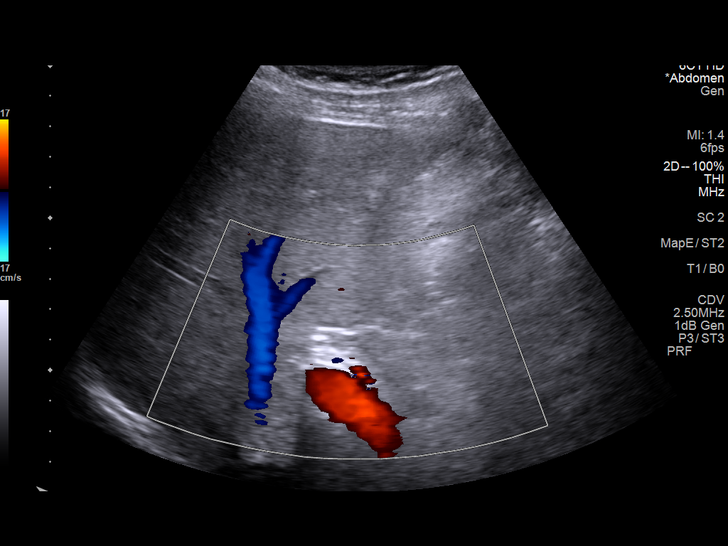
[im 26/41]
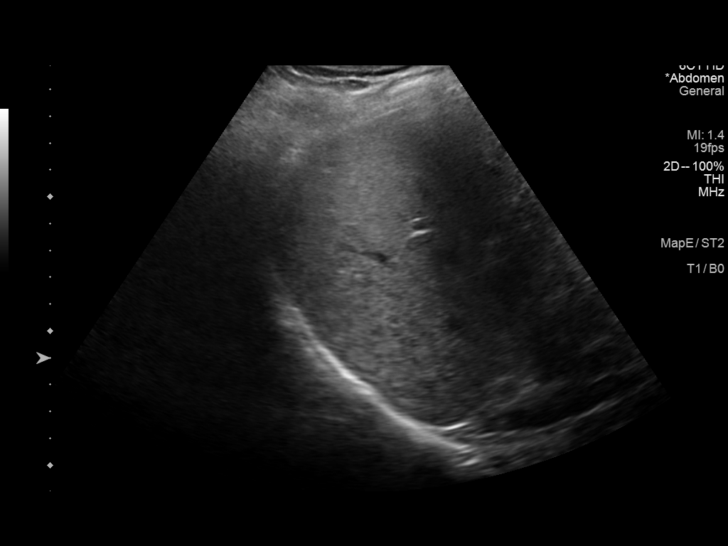
[im 27/41]
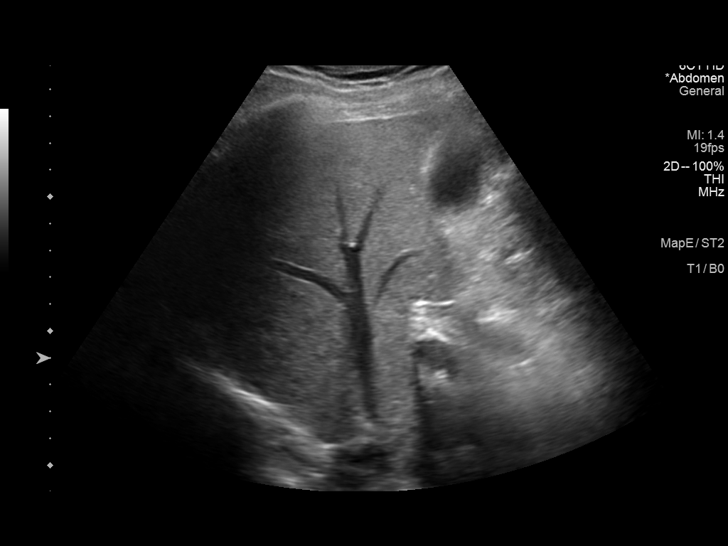
[im 31/41]
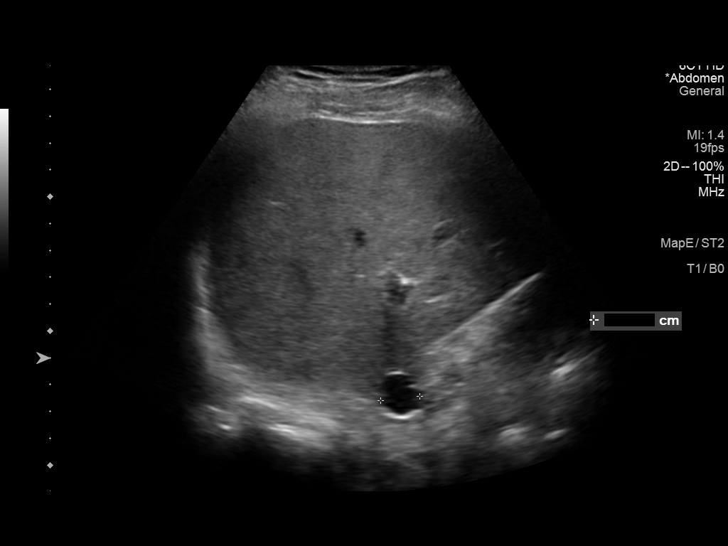
[im 34/41]
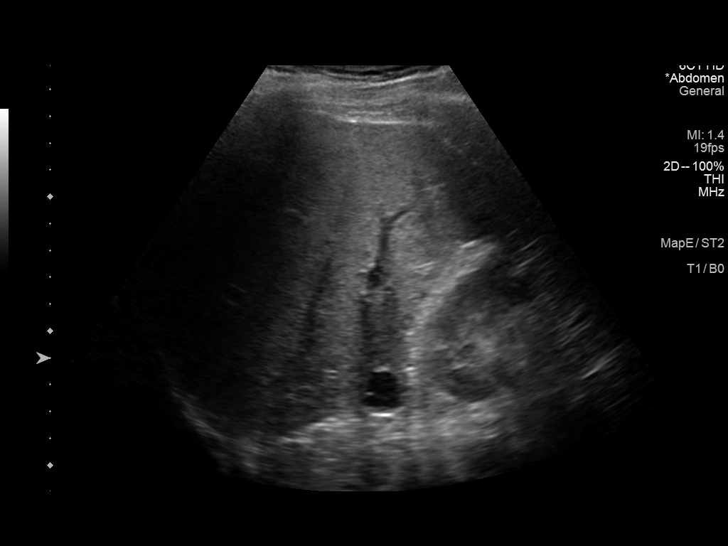
[im 37/41]
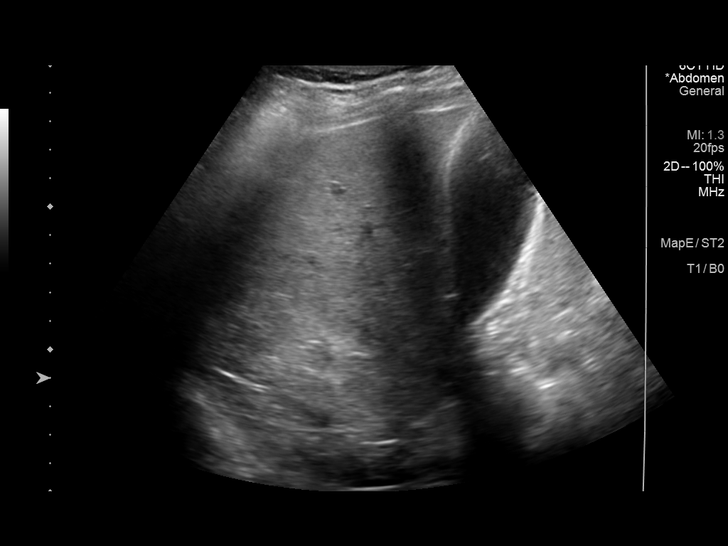
[im 41/41]
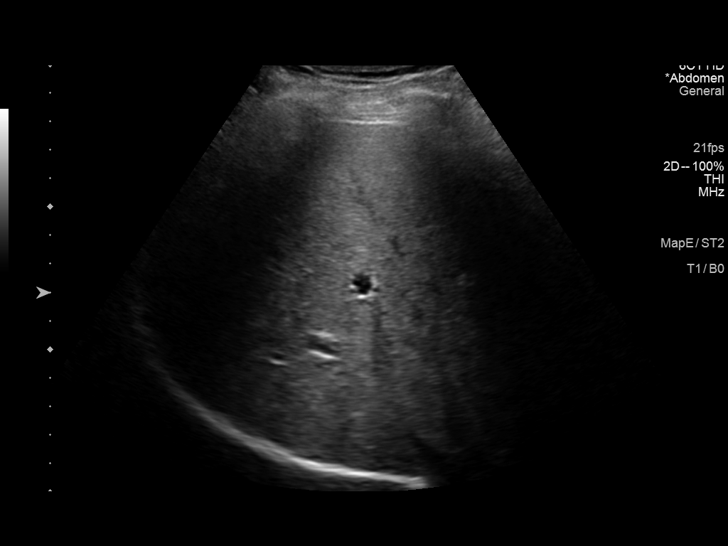

[14 of 25 positions shown; findings below may reference images not displayed]

FINDINGS: Gallbladder:

No gallstones or wall thickening visualized. No sonographic Murphy
sign noted by sonographer.

Common bile duct:

Diameter: 2.9 mm

Liver:

Small septated cyst in the right hepatic lobe measuring 1.3 x 1.5 x
1.5 cm. Within normal limits in parenchymal echogenicity. Portal
vein is patent on color Doppler imaging with normal direction of
blood flow towards the liver.

Other: None.
IMPRESSION: 1. Negative for gallbladder disease or biliary dilatation
2. Stable small septated cyst in the inferior right hepatic lobe

## 2020-04-24 ENCOUNTER — Other Ambulatory Visit: Payer: Self-pay

## 2020-04-24 ENCOUNTER — Ambulatory Visit (HOSPITAL_COMMUNITY): Payer: 59 | Attending: Cardiology

## 2020-04-24 DIAGNOSIS — I712 Thoracic aortic aneurysm, without rupture, unspecified: Secondary | ICD-10-CM

## 2020-04-24 DIAGNOSIS — I358 Other nonrheumatic aortic valve disorders: Secondary | ICD-10-CM | POA: Insufficient documentation

## 2020-04-24 HISTORY — PX: TRANSTHORACIC ECHOCARDIOGRAM: SHX275

## 2020-04-24 LAB — ECHOCARDIOGRAM COMPLETE
Area-P 1/2: 5.38 cm2
S' Lateral: 2.7 cm

## 2020-05-01 ENCOUNTER — Telehealth: Payer: Self-pay | Admitting: *Deleted

## 2020-05-01 DIAGNOSIS — I712 Thoracic aortic aneurysm, without rupture, unspecified: Secondary | ICD-10-CM

## 2020-05-01 DIAGNOSIS — R931 Abnormal findings on diagnostic imaging of heart and coronary circulation: Secondary | ICD-10-CM

## 2020-05-01 NOTE — Telephone Encounter (Signed)
The patient has been notified of the result and verbalized understanding.  All questions (if any) were answered. Ct of aorta order placed Tobin Chad, RN 05/01/2020 11:05 AM

## 2020-05-01 NOTE — Telephone Encounter (Signed)
-----   Message from Marykay Lex, MD sent at 04/27/2020  1:27 AM EDT ----- Transthoracic echocardiogram shows normal pump function with ejection fraction of 60 to 65% (normal range is 50 to 70%).  No wall motion normalities to suggest prior heart attack. Grade 1 diastolic dysfunction probably normal for age.  Normal right ventricle.  Mitral valve has mild mitral prolapse without significant regurgitation. The aortic valve has thickening and calcification consistent with sclerosis (hardening of) but not stenosis (narrowing of) -> consider monitor. The ascending aorta is measured at 45 mm.  This showed some progression by echo which may not be the same as CT.  I think we probably should go ahead and follow-up with a CT scan as well since there has been an increase in diameter.  Plan: Check CTA Chest - Aortogram  - Indication thoracic aortic aneurysm   Bryan Lemma, MD

## 2020-05-03 ENCOUNTER — Telehealth: Payer: Self-pay | Admitting: Cardiology

## 2020-05-03 NOTE — Telephone Encounter (Signed)
Spoke with patient regarding the 05/15/20 7:30 am CTA chest/aorta scheduled t [place]---[address], [address]---arrival time is 7:15 am for check in ---liquids only  only 4 hours prior to study----patient voiced his understanding.

## 2020-05-15 ENCOUNTER — Ambulatory Visit (INDEPENDENT_AMBULATORY_CARE_PROVIDER_SITE_OTHER)
Admission: RE | Admit: 2020-05-15 | Discharge: 2020-05-15 | Disposition: A | Discharge status: Home / Self Care | Payer: 59 | Source: Ambulatory Visit | Attending: Cardiology | Admitting: Cardiology

## 2020-05-15 ENCOUNTER — Other Ambulatory Visit: Payer: Self-pay

## 2020-05-15 DIAGNOSIS — I712 Thoracic aortic aneurysm, without rupture, unspecified: Secondary | ICD-10-CM

## 2020-05-15 DIAGNOSIS — R931 Abnormal findings on diagnostic imaging of heart and coronary circulation: Secondary | ICD-10-CM

## 2020-05-15 MED ORDER — IOHEXOL 350 MG/ML SOLN
100.0000 mL | Freq: Once | INTRAVENOUS | Status: AC | PRN
  Administered 2020-05-15: 100 mL via INTRAVENOUS

## 2020-06-04 ENCOUNTER — Other Ambulatory Visit: Payer: Self-pay

## 2020-06-04 ENCOUNTER — Encounter: Payer: Self-pay | Admitting: Cardiology

## 2020-06-04 ENCOUNTER — Ambulatory Visit (INDEPENDENT_AMBULATORY_CARE_PROVIDER_SITE_OTHER): Payer: 59 | Admitting: Cardiology

## 2020-06-04 VITALS — BP 110/80 | HR 86 | Ht 73.0 in | Wt 196.0 lb

## 2020-06-04 DIAGNOSIS — I712 Thoracic aortic aneurysm, without rupture, unspecified: Secondary | ICD-10-CM

## 2020-06-04 DIAGNOSIS — I341 Nonrheumatic mitral (valve) prolapse: Secondary | ICD-10-CM | POA: Diagnosis not present

## 2020-06-04 DIAGNOSIS — E785 Hyperlipidemia, unspecified: Secondary | ICD-10-CM

## 2020-06-04 NOTE — Patient Instructions (Addendum)
Medication Instructions:  No changes  *If you need a refill on your cardiac medications before your next appointment, please call your pharmacy*   Lab Work: Lipid- fasting CMP If you have labs (blood work) drawn today and your tests are completely normal, you will receive your results only by: Marland Kitchen MyChart Message (if you have MyChart) OR . A paper copy in the mail If you have any lab test that is abnormal or we need to change your treatment, we will call you to review the results.   Testing/Procedures: Will be schedule in April 2023 Cardiac CT Angiography (CTA) of aorta, is a special type of CT scan that uses a computer to produce multi-dimensional views of major blood vessels throughout the body. In CT angiography, a contrast material is injected through an IV to help visualize the blood vessels   Follow-Up: At [facility], you and your health needs are our priority.  As part of our continuing mission to provide you with exceptional heart care, we have created designated Provider Care Teams.  These Care Teams include your primary Cardiologist (physician) and Advanced Practice Providers (APPs -  Physician Assistants and Nurse Practitioners) who all work together to provide you with the care you need, when you need it.     Your next appointment:   12 month(s)  The format for your next appointment:   In Person  Provider:   Bryan Lemma, MD

## 2020-06-04 NOTE — Progress Notes (Signed)
Primary Care Provider: Farris Has, MD Cardiologist: Bryan Lemma, MD Electrophysiologist: None  Clinic Note: Chief Complaint  Patient presents with  . Follow-up    Doing well.  No major issues. Lost both father-in-law and father in the fall of 2021.  Lots of stress  . Dilated thoracic aorta    Echo and CTA results   ===================================  ASSESSMENT/PLAN   Problem List Items Addressed This Visit    Thoracic aortic aneurysm without rupture (HCC) - ~4.1-4.2 cm - Primary (Chronic)    Interestingly, the echocardiogram read that aorta being dilated to 4.5 cm where his the CT scan showed stable 4.3 cm.  This indicates that the CT scan is probably more accurate reading.  Will recheck CTA of the aorta in April 2023, if stable we can then check every couple years.  Continue aspect modification: Blood pressure stable.  Lipids and glycemic control have not been checked recently.      Relevant Orders   EKG 12-Lead (Completed)   CT ANGIO CHEST AORTA W/CM & OR WO/CM   Lipid panel (Completed)   Comprehensive metabolic panel (Completed)   Mild mitral valve prolapse (Chronic)    Mild P2 MVP noted on echo.  No significant murmur heard from mild regurgitation, there is only aortic sclerosis murmur heard.  Probably does not need to be checked for another couple years.  Would recommend checking every 3 to 5 years.      Relevant Orders   EKG 12-Lead (Completed)   Lipid panel (Completed)   Comprehensive metabolic panel (Completed)   Dyslipidemia, goal LDL below 100 (Chronic)    PCP has been following lipids, but not readily available for me to evaluate.  Not currently on statin.  With thoracic aortic disease, low threshold to consider treatment if LDL is consistently above 70.  Would probably start with rosuvastatin 20 mg daily.  Check FLP & BMP -- consider Rosuvastatin 20 mg pending results.      Relevant Orders   EKG 12-Lead (Completed)   Lipid panel (Completed)    Comprehensive metabolic panel (Completed)      ===================================  HPI:    Charles Skinner is a 50 y.o. male with a PMH notable for Dyslipidemia, Dilated Thoracic Aorta, and Mild Mitral Prolapse who presents today for annual follow-up.  Charles Skinner was last seen on 05/24/2019 as a follow-up visit.  He had just had a CTA of the chest on showing thoracic aortic dilation to 4.3 cm.  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  He had not had any significant cardiac symptoms.  Was concerned little bit about his father's history of CAD with CABG.  But no chest pain or pressure.  Recent Hospitalizations: None  Reviewed  CV studies:    The following studies were reviewed today: (if available, images/films reviewed: From Epic Chart or Care Everywhere) . Transthoracic Echo 04/24/2020: EF 60 to 65%.  Mild LVH.  GR 1 DD.  Normal RV.  Mild holosystolic prolapse of P2 scallop.  Aortic valve calcification/thickening (sclerosis) but no stenosis.  Dilated ascending aorta-estimated 4.5 cm. . CTA Chest-Aorta 05/15/2020: Stable 4.3 cm ascending aortic aneurysm without complicating features.  Aortic arch 3.3 cm, descending aorta 2.5 cm.  No significant change.   Interval History:   Charles Skinner returns here today accompanied by his wife.  He really only notes having spells where he just feels somewhat tired with no energy.  Some exercise intolerance may be a little bit of exertional dyspnea.  But otherwise he has been quite worn out with emotional fatigue.  His father-in-law died last 10-12-2022 (he was actually a patient of mine - Willadean Carol) at age 67 after a long bout of progressively worsening dyspnea.  While this may have become a blessing, it was compounded by the fact that 9 months later, his father died of an MI at age 65.   Other than being just a little bit worn out, he is doing fine.  No major cardiac symptoms to speak of.   Exertional dyspnea is probably just because of deconditioning.  He got out of the habit of doing exercise because of 2 back-to-back funerals and all the emotional stress.  CV Review of Symptoms (Summary): no chest pain or dyspnea on exertion positive for - Episodic fatigue.  Feels tired more easily at times with little bit of exertional dyspnea /exercise intolerance negative for - chest pain, edema, irregular heartbeat, orthopnea, palpitations, paroxysmal nocturnal dyspnea, rapid heart rate, shortness of breath or Lightheadedness, dizziness or syncope/near syncope, TIA/amaurosis fugax, claudication  REVIEWED OF SYSTEMS   Review of Systems  Constitutional: Positive for malaise/fatigue (Episodes where he feels tired with exercise intolerance on dyspnea.  But not routinely.) and weight loss (Minimal since last year but in the last 2 years 12 pounds).  HENT: Positive for congestion. Negative for nosebleeds.   Respiratory: Negative for cough and shortness of breath.   Cardiovascular: Negative for chest pain, palpitations and leg swelling.  Gastrointestinal: Negative for abdominal pain, blood in stool and melena.  Genitourinary: Negative for frequency and hematuria.  Musculoskeletal: Negative for joint pain.  Neurological: Negative for dizziness and headaches.  Psychiatric/Behavioral: Negative for memory loss. The patient does not have insomnia.        Finally starting may be able to be a little bit more stable from a psychological standpoint after losing versus father-in-law and then his father.   I have reviewed and (if needed) personally updated the patient's problem list, medications, allergies, past medical and surgical history, social and family history.   PAST MEDICAL HISTORY   Past Medical History:  Diagnosis Date  . Ascending aortic aneurysm (HCC) 02/2012   per last CTA 03-2017 --  TAA  4.3cm   (monitored by dr Maniah Nading (cardiologist)  . Bilateral inguinal hernia   . History of  Helicobacter pylori infection    2014-- resolved w/ antibiotics  . History of palpitations    2014--  resolved  . Mild obstructive sleep apnea    PER PT NO CPAP RECOMMENDATION    PAST SURGICAL HISTORY   Past Surgical History:  Procedure Laterality Date  . ANTERIOR CRUCIATE LIGAMENT REPAIR Left 1996  . INGUINAL HERNIA REPAIR N/A 10/17/2015   Procedure: LAPAROSCOPIC BILATERAL INGUINAL HERNIA REPAIR;  Surgeon: De Blanch Kinsinger, MD;  Location: [facility];  Service: General;  Laterality: N/A;  . INSERTION OF MESH Bilateral 10/17/2015   Procedure: INSERTION OF MESH;  Surgeon: Rodman Pickle, MD;  Location: [facility] [place];  Service: General;  Laterality: Bilateral;  . TRANSTHORACIC ECHOCARDIOGRAM  04/24/2020   EF 60 to 65%.  Mild LVH.  GR 1 DD.  Normal RV.  Mild holosystolic prolapse of P2 scallop.  Aortic valve calcification/thickening (sclerosis) but no stenosis.  Dilated ascending aorta-estimated 4.5 cm.    There is no immunization history for the selected administration types on file for this patient.  MEDICATIONS/ALLERGIES   Current Meds  Medication Sig  . FIBER ADULT GUMMIES PO Take by mouth.  . Multiple Vitamins-Minerals (CENTRUM MEN) TABS Take by mouth.  Marland Kitchen. omeprazole (PRILOSEC) 40 MG capsule Take 40 mg by mouth daily.  . TURMERIC PO Take by mouth.  . Vitamin D, Ergocalciferol, (DRISDOL) 1.25 MG (50000 UT) CAPS capsule Take 5,000 Units by mouth daily.    No Known Allergies  SOCIAL HISTORY/FAMILY HISTORY   Reviewed in Epic:  Pertinent findings:  Social History   Tobacco Use  . Smoking status: Never Smoker  . Smokeless tobacco: Never Used  Substance Use Topics  . Alcohol use: No  . Drug use: No   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    OBJCTIVE -PE, EKG, labs   Wt Readings from Last 3 Encounters:  06/04/20  196 lb (88.9 kg)  05/24/19 199 lb 9.6 oz (90.5 kg)  05/03/18 208 lb (94.3 kg)    Physical Exam: BP 110/80   Pulse 86   Ht 6\' 1"  (1.854 m)   Wt 196 lb (88.9 kg)   SpO2 99%   BMI 25.86 kg/m  Physical Exam Vitals reviewed.  Constitutional:      General: He is not in acute distress.    Appearance: Normal appearance. He is normal weight. He is not ill-appearing or toxic-appearing.  HENT:     Head: Normocephalic and atraumatic.  Neck:     Vascular: No carotid bruit (Radiated aortic murmur).  Cardiovascular:     Rate and Rhythm: Normal rate and regular rhythm.     Pulses: Normal pulses.     Heart sounds: Murmur (1/6 SEM at RUSB--neck) heard.  No friction rub. No gallop.   Pulmonary:     Effort: Pulmonary effort is normal. No respiratory distress.     Breath sounds: Normal breath sounds. No wheezing, rhonchi or rales.  Chest:     Chest wall: No tenderness.  Musculoskeletal:        General: No swelling. Normal range of motion.     Cervical back: Normal range of motion and neck supple.  Skin:    General: Skin is warm and dry.  Neurological:     General: No focal deficit present.     Mental Status: He is alert and oriented to person, place, and time.  Psychiatric:        Mood and Affect: Mood normal.        Behavior: Behavior normal.        Thought Content: Thought content normal.        Judgment: Judgment normal.     Adult ECG Report  Rate: 86 ;  Rhythm: normal sinus rhythm and Normal axis, intervals and durations.;   Narrative Interpretation: Stable/normal  Recent Labs: No new labs available. Lab Results  Component Value Date   CHOL 182 06/11/2020   HDL 45 06/11/2020   LDLCALC 125 (H) 06/11/2020   TRIG 61 06/11/2020   CHOLHDL 4.0 06/11/2020   Lab Results  Component Value Date   CREATININE 0.94 06/11/2020   BUN 16 06/11/2020   NA 140 06/11/2020   K 4.2 06/11/2020   CL 105 06/11/2020   CO2 23 06/11/2020   CBC Latest Ref Rng & Units 10/17/2015 08/14/2012  03/06/2012  WBC 4.0 - 10.5 K/uL - 7.5 9.4  Hemoglobin 13.0 - 17.0 g/dL 16.115.7 09.615.5 04.516.9  Hematocrit 39.0 -  52.0 % - 42.4 46.1  Platelets 150 - 400 K/uL - 188 157    Lab Results  Component Value Date   TSH 2.082 03/06/2012    ==================================================  COVID-19 Education: The signs and symptoms of COVID-19 were discussed with the patient and how to seek care for testing (follow up with PCP or arrange E-visit).    I spent a total of with the patient spent in direct patient consultation.  Additional time spent with chart review  / charting (studies, outside notes, etc): 12 min Total Time: 42 min  Current medicines are reviewed at length with the patient today.  (+/- concerns) n/a  This visit occurred during the SARS-CoV-2 public health emergency.  Safety protocols were in place, including screening questions prior to the visit, additional usage of staff PPE, and extensive cleaning of exam room while observing appropriate contact time as indicated for disinfecting solutions.  Notice: This dictation was prepared with Dragon dictation along with smaller phrase technology. Any transcriptional errors that result from this process are unintentional and may not be corrected upon review.  Patient Instructions / Medication Changes & Studies & Tests Ordered   Patient Instructions  Medication Instructions:  No changes  *If you need a refill on your cardiac medications before your next appointment, please call your pharmacy*   Lab Work: Lipid- fasting CMP If you have labs (blood work) drawn today and your tests are completely normal, you will receive your results only by: Marland Kitchen MyChart Message (if you have MyChart) OR . A paper copy in the mail If you have any lab test that is abnormal or we need to change your treatment, we will call you to review the results.   Testing/Procedures: Will be schedule in April 2023 Cardiac CT Angiography (CTA) of aorta, is a  special type of CT scan that uses a computer to produce multi-dimensional views of major blood vessels throughout the body. In CT angiography, a contrast material is injected through an IV to help visualize the blood vessels   Follow-Up: At [facility], you and your health needs are our priority.  As part of our continuing mission to provide you with exceptional heart care, we have created designated Provider Care Teams.  These Care Teams include your primary Cardiologist (physician) and Advanced Practice Providers (APPs -  Physician Assistants and Nurse Practitioners) who all work together to provide you with the care you need, when you need it.     Your next appointment:   12 month(s)  The format for your next appointment:   In Person  Provider:   Bryan Lemma, MD    Studies Ordered:   Orders Placed This Encounter  Procedures  . CT ANGIO CHEST AORTA W/CM & OR WO/CM  . Lipid panel  . Comprehensive metabolic panel  . EKG 12-Lead     Bryan Lemma, M.D., M.S. Interventional Cardiologist   Pager # [phone] Phone # [phone] [address] [place], [place] [address]   Thank you for choosing [facility]!!

## 2020-06-11 LAB — COMPREHENSIVE METABOLIC PANEL
ALT: 20 IU/L (ref 0–44)
AST: 14 IU/L (ref 0–40)
Albumin/Globulin Ratio: 2.2 (ref 1.2–2.2)
Albumin: 4.3 g/dL (ref 3.8–4.9)
Alkaline Phosphatase: 61 IU/L (ref 44–121)
BUN/Creatinine Ratio: 17 (ref 9–20)
BUN: 16 mg/dL (ref 6–24)
Bilirubin Total: 0.3 mg/dL (ref 0.0–1.2)
CO2: 23 mmol/L (ref 20–29)
Calcium: 9.2 mg/dL (ref 8.7–10.2)
Chloride: 105 mmol/L (ref 96–106)
Creatinine, Ser: 0.94 mg/dL (ref 0.76–1.27)
Globulin, Total: 2 g/dL (ref 1.5–4.5)
Glucose: 96 mg/dL (ref 65–99)
Potassium: 4.2 mmol/L (ref 3.5–5.2)
Sodium: 140 mmol/L (ref 134–144)
Total Protein: 6.3 g/dL (ref 6.0–8.5)
eGFR: 98 mL/min/{1.73_m2} (ref >59)

## 2020-06-11 LAB — LIPID PANEL
Chol/HDL Ratio: 4 ratio (ref 0.0–5.0)
Cholesterol, Total: 182 mg/dL (ref 100–199)
HDL: 45 mg/dL (ref >39)
LDL Chol Calc (NIH): 125 mg/dL — ABNORMAL HIGH (ref 0–99)
Triglycerides: 61 mg/dL (ref 0–149)
VLDL Cholesterol Cal: 12 mg/dL (ref 5–40)

## 2020-06-27 ENCOUNTER — Encounter: Payer: Self-pay | Admitting: Cardiology

## 2020-06-27 NOTE — Assessment & Plan Note (Signed)
Mild P2 MVP noted on echo.  No significant murmur heard from mild regurgitation, there is only aortic sclerosis murmur heard.  Probably does not need to be checked for another couple years.  Would recommend checking every 3 to 5 years.

## 2020-06-27 NOTE — Assessment & Plan Note (Addendum)
PCP has been following lipids, but not readily available for me to evaluate.  Not currently on statin.  With thoracic aortic disease, low threshold to consider treatment if LDL is consistently above 70.  Would probably start with rosuvastatin 20 mg daily.  Check FLP & BMP -- consider Rosuvastatin 20 mg pending results.

## 2020-06-27 NOTE — Assessment & Plan Note (Signed)
Interestingly, the echocardiogram read that aorta being dilated to 4.5 cm where his the CT scan showed stable 4.3 cm.  This indicates that the CT scan is probably more accurate reading.  Will recheck CTA of the aorta in April 2023, if stable we can then check every couple years.  Continue aspect modification: Blood pressure stable.  Lipids and glycemic control have not been checked recently.

## 2020-06-29 ENCOUNTER — Other Ambulatory Visit: Payer: Self-pay

## 2020-06-29 DIAGNOSIS — E785 Hyperlipidemia, unspecified: Secondary | ICD-10-CM

## 2020-06-29 MED ORDER — ROSUVASTATIN CALCIUM 20 MG PO TABS: 20.0000 mg | ORAL_TABLET | Freq: Every day | ORAL | 3 refills | Status: DC

## 2020-06-29 NOTE — Progress Notes (Signed)
crestor

## 2020-08-13 ENCOUNTER — Telehealth: Payer: Self-pay | Admitting: Cardiology

## 2020-08-13 NOTE — Telephone Encounter (Signed)
The patient called in stating that since starting the Crestor that he has been having joint pain and nausea. He wants to know if there is another medication to try. He will stay off of it to see if the pain gets better while being off of the medication.

## 2020-08-13 NOTE — Telephone Encounter (Signed)
Hold for total of 2 weeks, then lets see if he can restart at 10 mg (1/2 tablet) at least every other day.   If tolerated after a month, gradually increase to daily.  Not really another better option then rosuvastatin when it comes to side effects.  If not able to tolerate, we can try atorvastatin.  The goal is to keep the lipids down to prevent progression of thoracic aortic atherosclerosis, but also coronary disease.  Bryan Lemma, MD

## 2020-08-13 NOTE — Telephone Encounter (Signed)
Pt c/o medication issue:  1. Name of Medication: rosuvastatin (CRESTOR) 20 MG tablet   2. How are you currently taking this medication (dosage and times per day)? Take 1 tablet (20 mg total) by mouth daily.  3. Are you having a reaction (difficulty breathing--STAT)? Joint / back pain & nausea  4. What is your medication issue? Herbie Baltimore recently put pt on generic Crestor.. pt is experiencing joint and back pain as well as nausea. Would like to know if he should stick with rx or if alternative is available please advise.

## 2020-08-14 NOTE — Telephone Encounter (Signed)
Called patient, advised of message from MD. Patient will try this, will call us back with any issues.

## 2020-08-14 NOTE — Telephone Encounter (Signed)
Patient returning call.

## 2020-08-14 NOTE — Telephone Encounter (Signed)
Left a message for the patient to call back.  

## 2021-06-04 ENCOUNTER — Other Ambulatory Visit: Payer: Self-pay | Admitting: *Deleted

## 2021-06-04 DIAGNOSIS — I7121 Aneurysm of the ascending aorta, without rupture: Secondary | ICD-10-CM

## 2021-06-13 ENCOUNTER — Ambulatory Visit (INDEPENDENT_AMBULATORY_CARE_PROVIDER_SITE_OTHER)
Admission: RE | Admit: 2021-06-13 | Discharge: 2021-06-13 | Disposition: A | Discharge status: Home / Self Care | Payer: Commercial Managed Care - HMO | Source: Ambulatory Visit | Attending: Cardiology | Admitting: Cardiology

## 2021-06-13 DIAGNOSIS — I7121 Aneurysm of the ascending aorta, without rupture: Secondary | ICD-10-CM

## 2021-06-13 MED ORDER — IOHEXOL 350 MG/ML SOLN
100.0000 mL | Freq: Once | INTRAVENOUS | Status: AC | PRN
Start: 2021-06-13 — End: 2021-06-13
  Administered 2021-06-13: 100 mL via INTRAVENOUS

## 2021-06-18 ENCOUNTER — Other Ambulatory Visit: Payer: Self-pay | Admitting: *Deleted

## 2021-06-18 DIAGNOSIS — I7123 Aneurysm of the descending thoracic aorta, without rupture: Secondary | ICD-10-CM

## 2021-06-18 DIAGNOSIS — I7121 Aneurysm of the ascending aorta, without rupture: Secondary | ICD-10-CM

## 2021-07-17 ENCOUNTER — Encounter (HOSPITAL_COMMUNITY): Payer: Self-pay

## 2021-07-17 ENCOUNTER — Ambulatory Visit (HOSPITAL_COMMUNITY)
Admission: RE | Admit: 2021-07-17 | Discharge: 2021-07-17 | Disposition: A | Discharge status: Home / Self Care | Payer: Commercial Managed Care - HMO | Source: Ambulatory Visit | Attending: Family Medicine | Admitting: Family Medicine

## 2021-07-17 VITALS — BP 128/78 | HR 89 | Temp 98.2°F | Resp 18

## 2021-07-17 DIAGNOSIS — J0100 Acute maxillary sinusitis, unspecified: Secondary | ICD-10-CM

## 2021-07-17 MED ORDER — AMOXICILLIN-POT CLAVULANATE 875-125 MG PO TABS
1.0000 | ORAL_TABLET | Freq: Two times a day (BID) | ORAL | 0 refills | Status: DC
Start: 2021-07-17 — End: 2021-11-20

## 2021-07-17 NOTE — ED Triage Notes (Signed)
Pt reports cough, post nasal drainage, sinus pressure and nasal congestion for 3/4 weeks. States symptoms will start to improve then come back. States last couple of days he has woken up and feels hoarse. Tried mucinex and daily Pepcid with some relief

## 2021-07-17 NOTE — ED Provider Notes (Signed)
[facility]   [id number] 07/17/21 Arrival Time: 1015  ASSESSMENT & PLAN:  1. Acute non-recurrent maxillary sinusitis    Lungs clear. Begin: Meds ordered this encounter  Medications   amoxicillin-clavulanate (AUGMENTIN) 875-125 MG tablet    Sig: Take 1 tablet by mouth every 12 (twelve) hours.    Dispense:  20 tablet    Refill:  0   Discussed typical duration of symptoms. OTC symptom care as needed. Ensure adequate fluid intake and rest.   Follow-up Information     Farris Has, MD.   Specialty: Family Medicine Why: If worsening or failing to improve as anticipated. Contact information: [address] [place] [address] [phone]                 Reviewed expectations re: course of current medical issues. Questions answered. Outlined signs and symptoms indicating need for more acute intervention. Patient verbalized understanding. After Visit Summary given.   SUBJECTIVE: History from: patient.  Charles Skinner is a 51 y.o. male who presents with complaint of nasal congestion, post-nasal drainage, and sinus pain. Onset gradual, several weeks ago. Respiratory symptoms: mild dry cough; no SOB/wheezing. Fever: absent. Overall normal PO intake without n/v. OTC treatment: nasal spray; little help. 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 History of frequent sinus infections: no. No specific aggravating or alleviating factors  reported.  Social History   Tobacco Use  Smoking Status Never  Smokeless Tobacco Never    OBJECTIVE:  Vitals:   07/17/21 1109  BP: 128/78  Pulse: 89  Resp: 18  Temp: 98.2 F (36.8 C)  TempSrc: Oral  SpO2: 98%     General appearance: alert; no distress HEENT: nasal congestion; bilateral maxillary tenderness reported; Neck: supple without LAD; trachea midline Lungs: unlabored respirations, symmetrical air entry; cough: mild; no respiratory distress; CTAB Skin:  warm and dry Psychological: alert and cooperative; normal mood and affect  No Known Allergies  Past Medical History:  Diagnosis Date   Ascending aortic aneurysm (HCC) 02/2012   per last CTA 03-2017 --  TAA  4.3cm   (monitored by dr Herbie Baltimore (cardiologist)   Bilateral inguinal hernia    History of Helicobacter pylori infection    2014-- resolved w/ antibiotics   History of palpitations    2014--  resolved   Mild obstructive sleep apnea    PER PT NO CPAP RECOMMENDATION   Family History  Problem Relation Age of Onset   Heart attack Mother 51   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   Social Determinants of Health   Financial Resource Strain: Not on file  Food Insecurity: Not on file  Transportation Needs: Not on file  Physical Activity: Not on file  Stress: Not on file  Social Connections: Not on file  Intimate Partner Violence:  Not on file             Mardella Layman, MD 07/17/21 1130

## 2021-08-01 ENCOUNTER — Ambulatory Visit (HOSPITAL_COMMUNITY)
Admission: RE | Admit: 2021-08-01 | Discharge: 2021-08-01 | Disposition: A | Discharge status: Home / Self Care | Payer: Commercial Managed Care - HMO | Source: Ambulatory Visit | Attending: Internal Medicine | Admitting: Internal Medicine

## 2021-08-01 ENCOUNTER — Encounter (HOSPITAL_COMMUNITY): Payer: Self-pay

## 2021-08-01 VITALS — BP 124/83 | HR 80 | Temp 97.9°F | Resp 16 | Ht 73.0 in | Wt 205.0 lb

## 2021-08-01 DIAGNOSIS — N432 Other hydrocele: Secondary | ICD-10-CM | POA: Insufficient documentation

## 2021-08-01 DIAGNOSIS — N50811 Right testicular pain: Secondary | ICD-10-CM

## 2021-08-01 DIAGNOSIS — N451 Epididymitis: Secondary | ICD-10-CM | POA: Insufficient documentation

## 2021-08-01 DIAGNOSIS — N5089 Other specified disorders of the male genital organs: Secondary | ICD-10-CM | POA: Diagnosis present

## 2021-08-01 LAB — POCT URINALYSIS DIPSTICK, ED / UC
Bilirubin Urine: NEGATIVE
Glucose, UA: NEGATIVE mg/dL
Hgb urine dipstick: NEGATIVE
Ketones, ur: NEGATIVE mg/dL
Leukocytes,Ua: NEGATIVE
Nitrite: NEGATIVE
Protein, ur: NEGATIVE mg/dL
Specific Gravity, Urine: 1.025 (ref 1.005–1.030)
Urobilinogen, UA: 0.2 mg/dL (ref 0.0–1.0)
pH: 5 (ref 5.0–8.0)

## 2021-08-01 NOTE — ED Provider Notes (Signed)
[facility]    CSN: [id number] Arrival date & time: 08/01/21  1155      History   Chief Complaint Chief Complaint  Patient presents with   Testicle Pain    Swelling in right testicle with mild pain. - Entered by patient    HPI Charles Skinner is a 51 y.o. male comes to the urgent care with mild right testicular pain and swelling of 3 days duration.  Patient describes the pain as throbbing and of mild severity.  It is aggravated by palpation and usually the pain is worse towards the end of the day and better in the morning after he wakes up from bed.  No trauma to the testis.  No dysuria or urgency of urination.  He has had some frequency of urination.  No penile discharge.  No fever or chills.  No groin pain or swelling. HPI  Past Medical History:  Diagnosis Date   Ascending aortic aneurysm (HCC) 02/2012   per last CTA 03-2017 --  TAA  4.3cm   (monitored by dr harding (cardiologist)   Bilateral inguinal hernia    History of Helicobacter pylori infection    2014-- resolved w/ antibiotics   History of palpitations    2014--  resolved   Mild obstructive sleep apnea    PER PT NO CPAP RECOMMENDATION    Patient Active Problem List   Diagnosis Date Noted   Mild mitral valve prolapse 06/04/2020   Systolic murmur of aorta 05/24/2019   Dyslipidemia, goal LDL below 100 04/17/2017   Enlarged lymph node 04/15/2017   Pre-op testing 02/25/2017   Thoracic aortic aneurysm without rupture (HCC) - ~4.1-4.2 cm 02/21/2012    Past Surgical History:  Procedure Laterality Date   ANTERIOR CRUCIATE LIGAMENT REPAIR Left 1996   INGUINAL HERNIA REPAIR N/A 10/17/2015   Procedure: LAPAROSCOPIC BILATERAL INGUINAL HERNIA REPAIR;  Surgeon: Rodman Pickle, MD;  Location: [facility];  Service: General;  Laterality: N/A;   INSERTION OF MESH Bilateral 10/17/2015   Procedure: INSERTION OF MESH;  Surgeon: De Blanch Kinsinger, MD;  Location: [facility];  Service:  General;  Laterality: Bilateral;   TRANSTHORACIC ECHOCARDIOGRAM  04/24/2020   EF 60 to 65%.  Mild LVH.  GR 1 DD.  Normal RV.  Mild holosystolic prolapse of P2 scallop.  Aortic valve calcification/thickening (sclerosis) but no stenosis.  Dilated ascending aorta-estimated 4.5 cm.       Home Medications    Prior to Admission medications   Medication Sig Start Date End Date Taking? Authorizing Provider  Multiple Vitamins-Minerals (CENTRUM MEN) TABS Take by mouth.   Yes [provider]  Vitamin D, Ergocalciferol, (DRISDOL) 1.25 MG (50000 UT) CAPS capsule Take 5,000 Units by mouth daily.   Yes [provider]  amoxicillin-clavulanate (AUGMENTIN) 875-125 MG tablet Take 1 tablet by mouth every 12 (twelve) hours. 07/17/21   Mardella Layman, MD  FIBER ADULT GUMMIES PO Take by mouth.    [provider]  omeprazole (PRILOSEC) 40 MG capsule Take 40 mg by mouth daily.    [provider]  rosuvastatin (CRESTOR) 20 MG tablet Take 1 tablet (20 mg total) by mouth daily. 06/29/20 09/27/20  Marykay Lex, MD  TURMERIC PO Take by mouth.    [provider]    Family History Family History  Problem Relation Age of Onset   Heart attack Mother 17    Social History Social History   Tobacco Use   Smoking status: Never  Smokeless tobacco: Never  Substance Use Topics   Alcohol use: No   Drug use: No     Allergies   Patient has no known allergies.   Review of Systems Review of Systems  Respiratory: Negative.    Gastrointestinal: Negative.   Genitourinary:  Positive for frequency, scrotal swelling and testicular pain. Negative for flank pain.  Musculoskeletal: Negative.      Physical Exam Triage Vital Signs ED Triage Vitals  Enc Vitals Group     BP 08/01/21 1208 124/83     Pulse Rate 08/01/21 1208 80     Resp 08/01/21 1208 16     Temp 08/01/21 1208 97.9 F (36.6 C)     Temp Source 08/01/21 1208 Oral     SpO2 08/01/21 1208 97 %     Weight  08/01/21 1211 205 lb (93 kg)     Height 08/01/21 1211 6\' 1"  (1.854 m)     Head Circumference --      Peak Flow --      Pain Score 08/01/21 1211 3     Pain Loc --      Pain Edu? --      Excl. in GC? --    No data found.  Updated Vital Signs BP 124/83 (BP Location: Right Arm)   Pulse 80   Temp 97.9 F (36.6 C) (Oral)   Resp 16   Ht 6\' 1"  (1.854 m)   Wt 93 kg   SpO2 97%   BMI 27.05 kg/m   Visual Acuity Right Eye Distance:   Left Eye Distance:   Bilateral Distance:    Right Eye Near:   Left Eye Near:    Bilateral Near:     Physical Exam Vitals and nursing note reviewed.  Cardiovascular:     Rate and Rhythm: Normal rate and regular rhythm.  Genitourinary:    Penis: Normal.      Comments: Tender swelling of the right testicle.  Tenderness is worse in the posterior upper pole of the right testicle."  No scrotal erythema.  Left testicle is without swelling or pain.  No penile discharge. Skin:    Comments: No groin lymphadenopathy  Neurological:     Mental Status: He is alert.      UC Treatments / Results  Labs (all labs ordered are listed, but only abnormal results are displayed) Labs Reviewed  POCT URINALYSIS DIPSTICK, ED / UC    EKG   Radiology No results found.  Procedures Procedures (including critical care time)  Medications Ordered in UC Medications - No data to display  Initial Impression / Assessment and Plan / UC Course  I have reviewed the triage vital signs and the nursing notes.  Pertinent labs & imaging results that were available during my care of the patient were reviewed by me and considered in my medical decision making (see chart for details).     1.  Right testicular pain (varicocele versus epididymitis): Scrotal ultrasound ordered Point-of-care urinalysis is negative for urinary tract infection Tylenol/Motrin as needed for pain We will call patient with recommendations if labs are abnormal Return precautions given. Final  Clinical Impressions(s) / UC Diagnoses   Final diagnoses:  Testicular pain, right     Discharge Instructions      He is follow-up with your primary care physician Your urine test is negative for infection We will call you with recommendations if l testing results are abnormal   ED Prescriptions   None    PDMP  not reviewed this encounter.   Chase Picket, MD 08/03/21 1047

## 2021-08-01 NOTE — Discharge Instructions (Addendum)
He is follow-up with your primary care physician Your urine test is negative for infection We will call you with recommendations if l testing results are abnormal

## 2021-08-01 NOTE — ED Triage Notes (Signed)
Testicular pain and swelling on the right side for a few days. No known falls or injuries  Patient states 10 years ago had something similar. States was given a shot but does not remember the diagnosis or tests done.   No urinary issues, slight low back pain occasionally (ongoing). More frequent urination a few nights ago. Steady stream when urinating.

## 2021-08-02 ENCOUNTER — Telehealth (HOSPITAL_COMMUNITY): Payer: Self-pay | Admitting: Emergency Medicine

## 2021-08-02 MED ORDER — SULFAMETHOXAZOLE-TRIMETHOPRIM 800-160 MG PO TABS: 1.0000 | ORAL_TABLET | Freq: Two times a day (BID) | ORAL | 0 refills | Status: AC

## 2021-10-16 ENCOUNTER — Other Ambulatory Visit: Payer: Self-pay | Admitting: Cardiology

## 2021-10-16 ENCOUNTER — Ambulatory Visit: Payer: Commercial Managed Care - HMO | Attending: Cardiology

## 2021-10-16 DIAGNOSIS — I7123 Aneurysm of the descending thoracic aorta, without rupture: Secondary | ICD-10-CM

## 2021-10-16 DIAGNOSIS — I7121 Aneurysm of the ascending aorta, without rupture: Secondary | ICD-10-CM

## 2021-10-17 ENCOUNTER — Telehealth: Payer: Self-pay | Admitting: *Deleted

## 2021-10-17 DIAGNOSIS — E785 Hyperlipidemia, unspecified: Secondary | ICD-10-CM

## 2021-10-17 LAB — BASIC METABOLIC PANEL
BUN/Creatinine Ratio: 21 — ABNORMAL HIGH (ref 9–20)
BUN: 23 mg/dL (ref 6–24)
CO2: 22 mmol/L (ref 20–29)
Calcium: 9.2 mg/dL (ref 8.7–10.2)
Chloride: 104 mmol/L (ref 96–106)
Creatinine, Ser: 1.07 mg/dL (ref 0.76–1.27)
Glucose: 88 mg/dL (ref 70–99)
Potassium: 4.4 mmol/L (ref 3.5–5.2)
Sodium: 140 mmol/L (ref 134–144)
eGFR: 83 mL/min/{1.73_m2} (ref >59)

## 2021-10-17 NOTE — Telephone Encounter (Signed)
Spoke to patient . Patient has not had CT   angio chest aorta prior to up coming appointment.   Patient is aware  -RN would like reschedule follow up appointment with Dr Ellyn Hack until CT angio is completed.   Patient verbalized understanding   BMP was just  completed-  Appointment  reschedule to 11/19/21.  Rn wil Chief Operating Officer scheduling for radiology to schedule CT at [facility].

## 2021-10-17 NOTE — Telephone Encounter (Signed)
RN called patient back - CT angio chest aorta  w and w/o contrast.( [phone] scheduling)   Schedule for Oct 26,2023 at [facility] at 5 pm . Patient aware to be there at 4:45 pm ;liquid only for 4 hours prior to appointment.  Patient verbalized understanding  Patient also aware he can get lipid panel and hepatic panel before next appointment need to be fasting.

## 2021-10-22 ENCOUNTER — Ambulatory Visit: Payer: Commercial Managed Care - HMO | Admitting: Cardiology

## 2021-11-13 ENCOUNTER — Telehealth: Payer: Self-pay | Admitting: *Deleted

## 2021-11-13 NOTE — Telephone Encounter (Signed)
Spoke to patient  informed patient will cancel CTaorta scan for 11/14/21. Keep appointment 11/19/21. Will reschedule for test in April 2024 per Dr Ellyn Hack.   Patient stated the insurance would like for him to have testing done somewhere else. Patient will bring the list with him at his appointment.

## 2021-11-14 ENCOUNTER — Ambulatory Visit (HOSPITAL_BASED_OUTPATIENT_CLINIC_OR_DEPARTMENT_OTHER): Payer: Commercial Managed Care - HMO

## 2021-11-15 ENCOUNTER — Other Ambulatory Visit: Payer: Self-pay

## 2021-11-15 DIAGNOSIS — E785 Hyperlipidemia, unspecified: Secondary | ICD-10-CM

## 2021-11-15 LAB — LIPID PANEL
Chol/HDL Ratio: 4.5 ratio (ref 0.0–5.0)
Cholesterol, Total: 177 mg/dL (ref 100–199)
HDL: 39 mg/dL — ABNORMAL LOW (ref >39)
LDL Chol Calc (NIH): 125 mg/dL — ABNORMAL HIGH (ref 0–99)
Triglycerides: 68 mg/dL (ref 0–149)
VLDL Cholesterol Cal: 13 mg/dL (ref 5–40)

## 2021-11-15 LAB — HEPATIC FUNCTION PANEL
ALT: 18 IU/L (ref 0–44)
AST: 17 IU/L (ref 0–40)
Albumin: 4.7 g/dL (ref 3.8–4.9)
Alkaline Phosphatase: 74 IU/L (ref 44–121)
Bilirubin Total: 0.7 mg/dL (ref 0.0–1.2)
Bilirubin, Direct: 0.22 mg/dL (ref 0.00–0.40)
Total Protein: 6.5 g/dL (ref 6.0–8.5)

## 2021-11-19 ENCOUNTER — Ambulatory Visit: Payer: Commercial Managed Care - HMO | Attending: Cardiology | Admitting: Cardiology

## 2021-11-19 ENCOUNTER — Encounter: Payer: Self-pay | Admitting: Cardiology

## 2021-11-19 VITALS — BP 100/74 | HR 78 | Ht 73.0 in | Wt 205.2 lb

## 2021-11-19 DIAGNOSIS — I341 Nonrheumatic mitral (valve) prolapse: Secondary | ICD-10-CM

## 2021-11-19 DIAGNOSIS — I358 Other nonrheumatic aortic valve disorders: Secondary | ICD-10-CM

## 2021-11-19 DIAGNOSIS — E785 Hyperlipidemia, unspecified: Secondary | ICD-10-CM

## 2021-11-19 DIAGNOSIS — I7121 Aneurysm of the ascending aorta, without rupture: Secondary | ICD-10-CM | POA: Diagnosis not present

## 2021-11-19 MED ORDER — ROSUVASTATIN CALCIUM 10 MG PO TABS: 10.0000 mg | ORAL_TABLET | Freq: Every day | ORAL | 3 refills | Status: AC

## 2021-11-19 NOTE — Progress Notes (Unsigned)
Primary Care Provider: London Pepper, [place] Cardiologist: Glenetta Hew, MD Electrophysiologist: None  Clinic Note: No chief complaint on file.  ===================================  ASSESSMENT/PLAN   Problem List Items Addressed This Visit   None   ===================================  HPI:    Charles Skinner is a 51 y.o. male with a PMH notable for HLD, Mild Mitral Prolapse and Thoracic Aorta who presents today for 26-month follow-up.  BRODAN GREWELL was last seen on Jun 04, 2020 -> noted some exercise intolerance and/exertional fatigue.  His father-in-law Charles Skinner Angel-patient of mine) have passed away that past 2022/10/01 after a long bout of worsening COPD.  9 months after Charles Skinner dying, [name]'s father also passed away.  Apparently, he had gotten out of the habit of doing exercise between backpack funerals and all the emotional stress.  Recent Hospitalizations: ***  Reviewed  CV studies:    The following studies were reviewed today: (if available, images/films reviewed: From Epic Chart or Care Everywhere) CTA Chest 06/13/2021: Ascending aorta now measures 4.5 x 4.3 cm up from 4.3.  Recommend semiannual follow-up with either CTA or MRA.  Also noted is a 4 mm suspicious right solid pulmonary nodule within the upper lobe (if low risk, no need for follow-up; but if high risk would do 78-month follow-up noncontrast CT.  Interval History:   Charles Skinner   CV Review of Symptoms (Summary):  {roscv:310661}    REVIEWED OF SYSTEMS   ROS  Positive for malaise/fatigue (Episodes where he feels tired with exercise intolerance on dyspnea.  But not routinely.) and weight loss (Minimal since last year but in the last 2 years 12 pounds).  HENT: Positive for congestion.  not have insomnia.        Finally starting may be able to be a little bit more stable from a psychological standpoint after losing versus father-in-law and then his father  I have reviewed and (if needed)  personally updated the patient's problem list, medications, allergies, past medical and surgical history, social and family history.   PAST MEDICAL HISTORY   Past Medical History:  Diagnosis Date   Ascending aortic aneurysm ([place]) 02/2012   per last CTA 03-2017 --  TAA  4.3cm   (monitored by dr Kaison Mcparland (cardiologist)   Bilateral inguinal hernia    History of Helicobacter pylori infection    2014-- resolved w/ antibiotics   History of palpitations    2014--  resolved   Mild obstructive sleep apnea    PER PT NO CPAP RECOMMENDATION  CTA Chest-Aorta 05/15/2020: Stable 4.3 cm ascending aortic aneurysm without complicating features.  Aortic arch 3.3 cm, descending aorta 2.5 cm.  No significant change.  PAST SURGICAL HISTORY   Past Surgical History:  Procedure Laterality Date   ANTERIOR CRUCIATE LIGAMENT REPAIR Left 1996   INGUINAL HERNIA REPAIR N/A 10/17/2015   Procedure: LAPAROSCOPIC BILATERAL INGUINAL HERNIA REPAIR;  Surgeon: Mickeal Skinner, MD;  Location: [facility];  Service: General;  Laterality: N/A;   INSERTION OF MESH Bilateral 10/17/2015   Procedure: INSERTION OF MESH;  Surgeon: Mickeal Skinner, MD;  Location: [facility];  Service: General;  Laterality: Bilateral;   TRANSTHORACIC ECHOCARDIOGRAM  04/24/2020   EF 60 to 65%.  Mild LVH.  GR 1 DD.  Normal RV.  Mild holosystolic prolapse of P2 scallop.  Aortic valve calcification/thickening (sclerosis) but no stenosis.  Dilated ascending aorta-estimated 4.5 cm.    There is no immunization history for the selected administration  types on file for this patient.  MEDICATIONS/ALLERGIES   No outpatient medications have been marked as taking for the 11/19/21 encounter (Appointment) with Marykay Lex, MD.    No Known Allergies  SOCIAL HISTORY/FAMILY HISTORY   Reviewed in Epic:  Pertinent findings:  Social History   Tobacco Use   Smoking status: Never   Smokeless tobacco: Never   Substance Use Topics   Alcohol use: No   Drug use: No   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    OBJCTIVE -PE, EKG, labs   Wt Readings from Last 3 Encounters:  08/01/21 205 lb (93 kg)  06/04/20 196 lb (88.9 kg)  05/24/19 199 lb 9.6 oz (90.5 kg)    Physical Exam: There were no vitals taken for this visit. Physical Exam 1/6 SEM at RUSB-neck.   Adult ECG Report  Rate: *** ;  Rhythm: {rhythm:17366};   Narrative Interpretation: ***  Recent Labs:  ***  Lab Results  Component Value Date   CHOL 177 11/15/2021   HDL 39 (L) 11/15/2021   LDLCALC 125 (H) 11/15/2021   TRIG 68 11/15/2021   CHOLHDL 4.5 11/15/2021   Lab Results  Component Value Date   CREATININE 1.07 10/16/2021   BUN 23 10/16/2021   NA 140 10/16/2021   K 4.4 10/16/2021   CL 104 10/16/2021   CO2 22 10/16/2021      Latest Ref Rng & Units 10/17/2015    7:58 AM 08/14/2012    5:25 PM 03/06/2012    7:39 AM  CBC  WBC 4.0 - 10.5 K/uL  7.5  9.4   Hemoglobin 13.0 - 17.0 g/dL 71.6  96.7  89.3   Hematocrit 39.0 - 52.0 %  42.4  46.1   Platelets 150 - 400 K/uL  188  157     Lab Results  Component Value Date   HGBA1C 5.4 03/06/2012   Lab Results  Component Value Date   TSH 2.082 03/06/2012    ================================================== I spent a total of ***minutes with the patient spent in direct patient consultation.  Additional time spent with chart review  / charting (studies, outside notes, etc): *** min Total Time: *** min  Current medicines are reviewed at length with the patient today.  (+/- concerns) ***  Notice: This dictation was prepared with Dragon dictation along with smart phrase technology. Any transcriptional errors that result from this process are unintentional and may not be corrected upon review.  Studies Ordered:   No orders of the defined types were placed  in this encounter.  No orders of the defined types were placed in this encounter.   Patient Instructions / Medication Changes & Studies & Tests Ordered   There are no Patient Instructions on file for this visit.     Marykay Lex, MD, MS Bryan Lemma, M.D., M.S. Interventional Cardiologist  [facility]  Pager # [phone] Phone # [phone] [address] [place], [place] [address]   Thank you for choosing [facility] [place]!!

## 2021-11-19 NOTE — Patient Instructions (Addendum)
Medication Instructions:  Crestor  take 10 mg every other day  for month  if you tolerate then try 10 mg tablet daily . If not then return to 10 mg  tablet  every other daily   *If you need a refill on your cardiac medications before your next appointment, please call your pharmacy*   Lab Work: Jan or Feb 2204 fasting   Lipid Liver panel If you have labs (blood work) drawn today and your tests are completely normal, you will receive your results only by: [place] (if you have MyChart) OR A paper copy in the mail If you have any lab test that is abnormal or we need to change your treatment, we will call you to review the results.   Testing/Procedures: No changes   Follow-Up: At [facility], you and your health needs are our priority.  As part of our continuing mission to provide you with exceptional heart care, we have created designated Provider Care Teams.  These Care Teams include your primary Cardiologist (physician) and Advanced Practice Providers (APPs -  Physician Assistants and Nurse Practitioners) who all work together to provide you with the care you need, when you need it.     Your next appointment:   6 month(s)  The format for your next appointment:   In Person  Provider:   Glenetta Hew, MD    Other Instructions

## 2021-11-20 ENCOUNTER — Encounter: Payer: Self-pay | Admitting: Cardiology

## 2021-11-20 NOTE — Assessment & Plan Note (Addendum)
Laboratory check by PCP on 27 October and LDL was at 125.  Goal will be hopefully down to 78 least less than 100.  He tried statin in the past, but has been unable to tolerate it.  He the 20 mg rosuvastatin are very difficult for her to manage.  I have asked that he try [rosuvastatin at 10 mg every other day-he will do this for a month and then try to titrate up to daily if possible.  We can reassess labs in January February-FLP and LFTs.  Based on those results may determine if he needs to consider more aggressive management with PCSK9 inhibitor.  My intention would be to consider coronary calcium score which can help direct our therapy.

## 2021-11-20 NOTE — Assessment & Plan Note (Signed)
Mild aortic sclerosis on echo.  He is due for follow-up echo in the spring 2025.

## 2021-11-20 NOTE — Assessment & Plan Note (Signed)
Based on last echocardiogram results, would be due for follow-up echocardiogram in the spring 2025

## 2021-11-20 NOTE — Assessment & Plan Note (Addendum)
Recommendations from the April 2023 CT scan for 62-month follow-up CTA because the diameter of the change.  It was ordered and then canceled because potentially not covered by insurance.  It now is back on is a test ordered for November 30.  When we clarify what the plan is here.  I think it would be fine to have it done in November because of the recommendations from the radiologist.  Regardless he would be due for an annual follow-up check in April 2024.  However if the follow-up in November of this year is stable from April, then I would do an annual follow-up this November as opposed to next April. => Still not close to a diameter which we would be concerned for potential sugar.  As such, we can continue to monitor.  Continue blood pressure control which is great without any meds, along with lipid management.

## 2021-12-19 ENCOUNTER — Other Ambulatory Visit (HOSPITAL_BASED_OUTPATIENT_CLINIC_OR_DEPARTMENT_OTHER): Payer: Commercial Managed Care - HMO

## 2022-03-29 LAB — HEPATIC FUNCTION PANEL
ALT: 25 IU/L (ref 0–44)
AST: 22 IU/L (ref 0–40)
Albumin: 4.8 g/dL (ref 3.8–4.9)
Alkaline Phosphatase: 76 IU/L (ref 44–121)
Bilirubin Total: 0.8 mg/dL (ref 0.0–1.2)
Bilirubin, Direct: 0.21 mg/dL (ref 0.00–0.40)
Total Protein: 7.1 g/dL (ref 6.0–8.5)

## 2022-03-29 LAB — LIPID PANEL
Chol/HDL Ratio: 2.8 ratio (ref 0.0–5.0)
Cholesterol, Total: 129 mg/dL (ref 100–199)
HDL: 46 mg/dL (ref >39)
LDL Chol Calc (NIH): 70 mg/dL (ref 0–99)
Triglycerides: 63 mg/dL (ref 0–149)
VLDL Cholesterol Cal: 13 mg/dL (ref 5–40)

## 2022-06-12 ENCOUNTER — Ambulatory Visit (HOSPITAL_BASED_OUTPATIENT_CLINIC_OR_DEPARTMENT_OTHER)
Admission: RE | Admit: 2022-06-12 | Discharge: 2022-06-12 | Disposition: A | Discharge status: Home / Self Care | Payer: Commercial Managed Care - HMO | Source: Ambulatory Visit | Attending: Cardiology | Admitting: Cardiology

## 2022-06-12 DIAGNOSIS — I7123 Aneurysm of the descending thoracic aorta, without rupture: Secondary | ICD-10-CM | POA: Diagnosis present

## 2022-06-12 DIAGNOSIS — I7121 Aneurysm of the ascending aorta, without rupture: Secondary | ICD-10-CM | POA: Diagnosis present

## 2022-06-12 IMAGING — CT CT ANGIO CHEST
3 of 8 series · 18 of 46 positions shown · IV contrast (OMNIPAQUE 350)
Comparison: CT angiography chest 05/16/2019 and 05/17/2019.

CLINICAL DATA: Aortic aneurysm.  Follow-up.

EXAM:
CT ANGIOGRAPHY CHEST WITH CONTRAST
TECHNIQUE: Multidetector CT imaging of the chest was performed using the
standard protocol during bolus administration of intravenous
contrast. Multiplanar CT image reconstructions and MIPs were
obtained to evaluate the vascular anatomy.

[Series 4: aorta 3.0 bf37 2 · axial · 0.78mm/px · z∈[-360,-76]mm · 13 of 111 slices shown]
[im 8/111  lung]
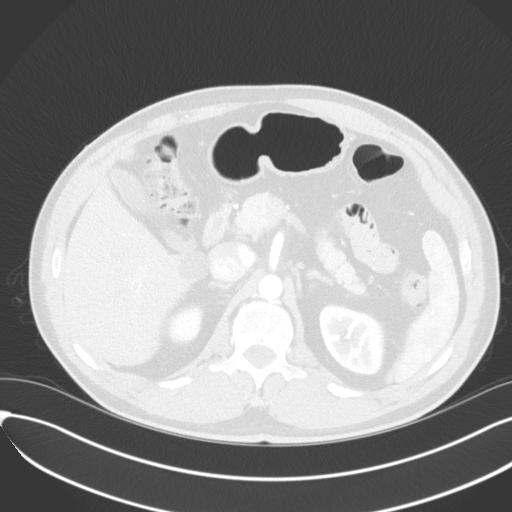
[im 16/111  soft-tissue]
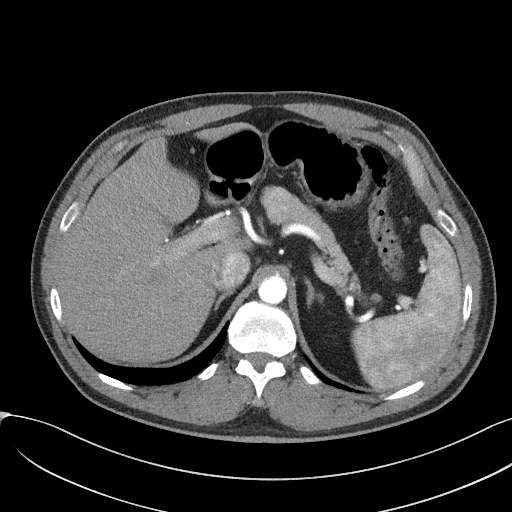
[im 24/111  lung]
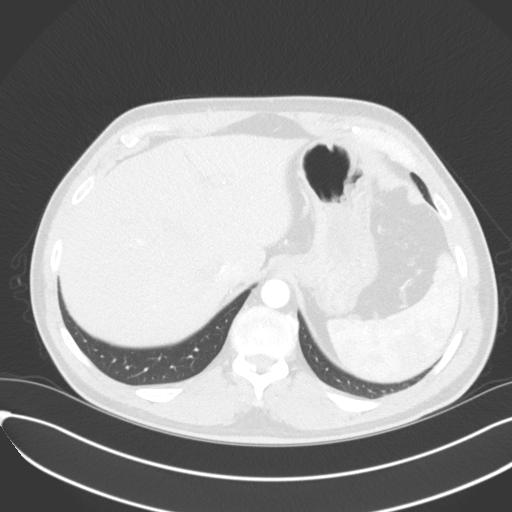
[im 32/111  soft-tissue]
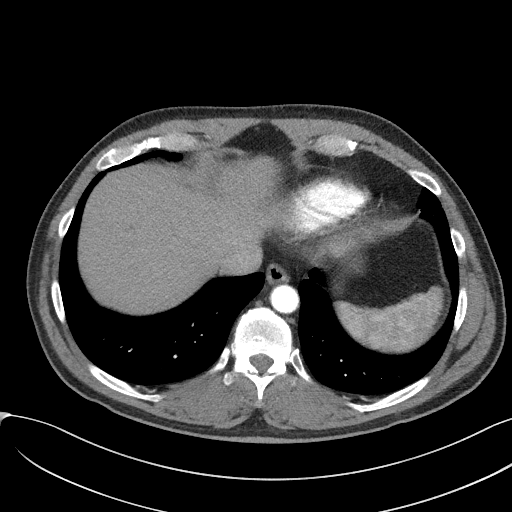
[im 40/111  lung]
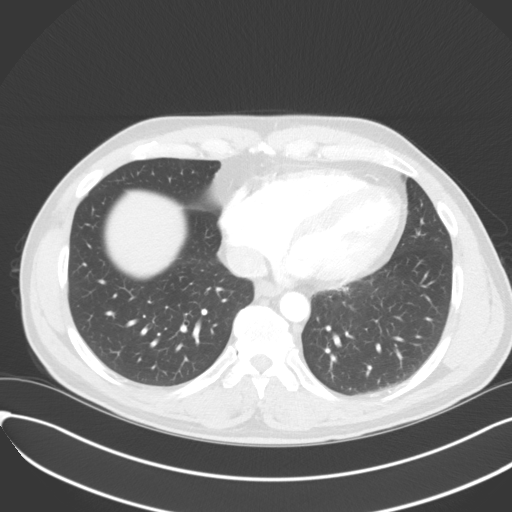
[im 48/111  soft-tissue]
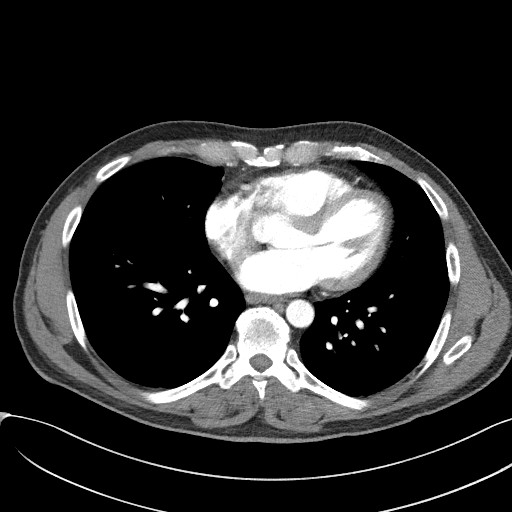
[im 56/111  lung]
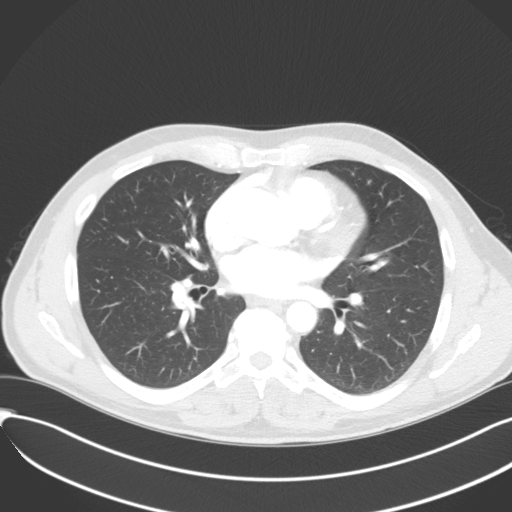
[im 63/111  soft-tissue]
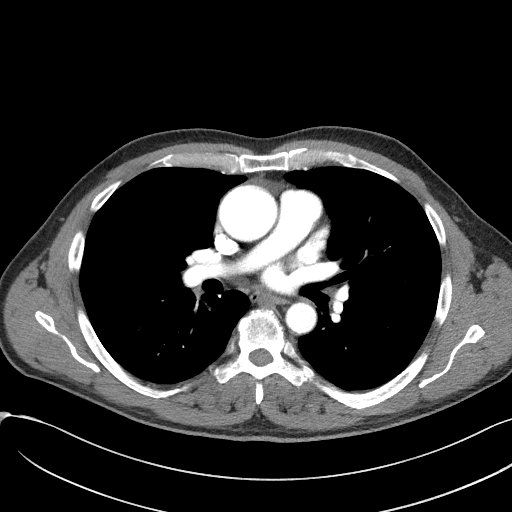
[im 71/111  lung]
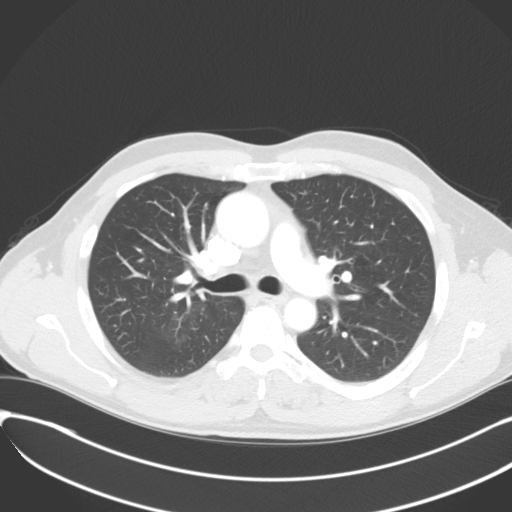
[im 79/111  soft-tissue]
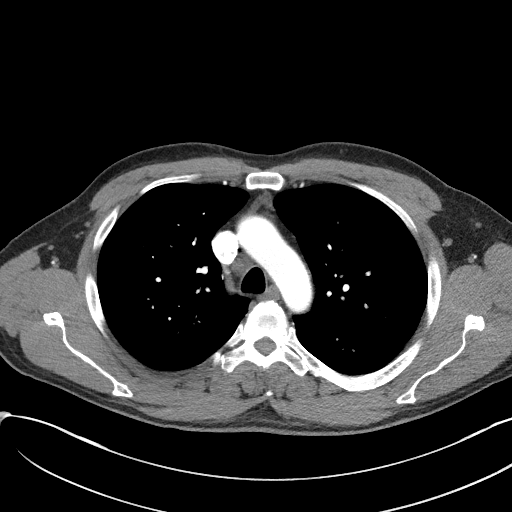
[im 87/111  lung]
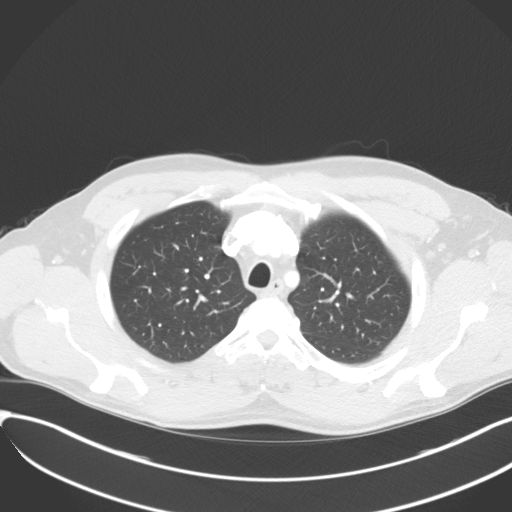
[im 95/111  soft-tissue]
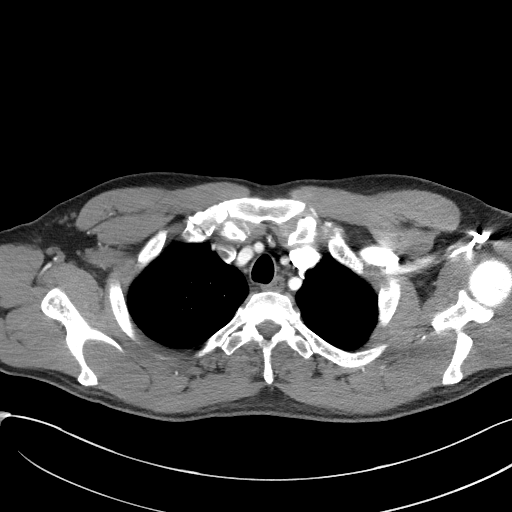
[im 103/111  lung]
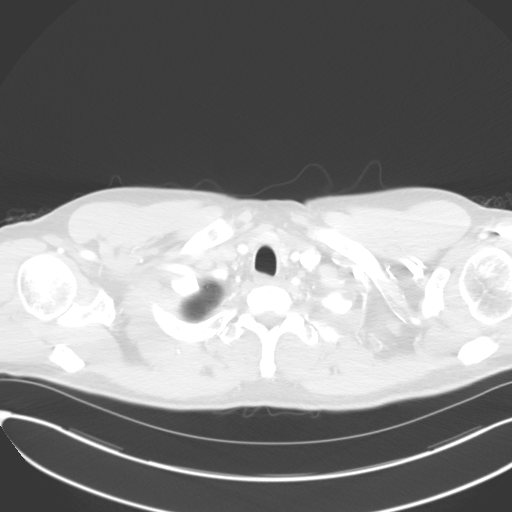

[Series 5: lung · axial · 0.78mm/px · z∈[-360,-312]mm · 2 of 111 slices shown]
[im 8/111  soft-tissue]
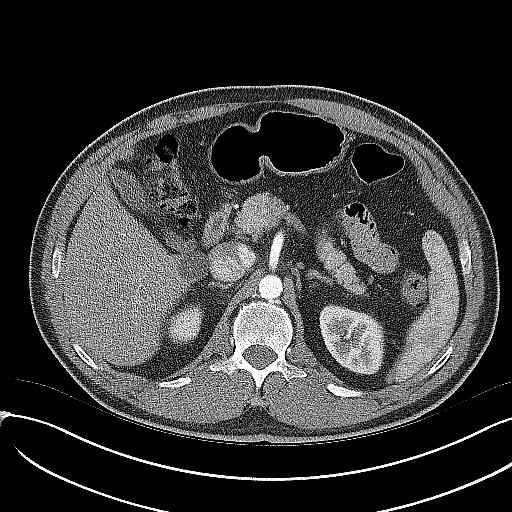
[im 24/111  soft-tissue]
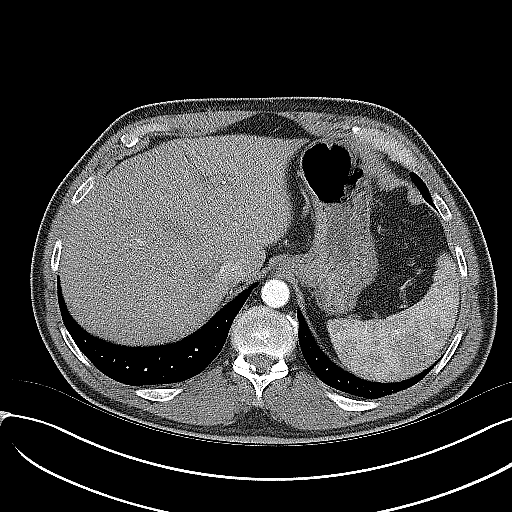

[Series 7: coronals · coronal · 0.66mm/px · 3 of 120 slices shown]
[im 30/120  soft-tissue]
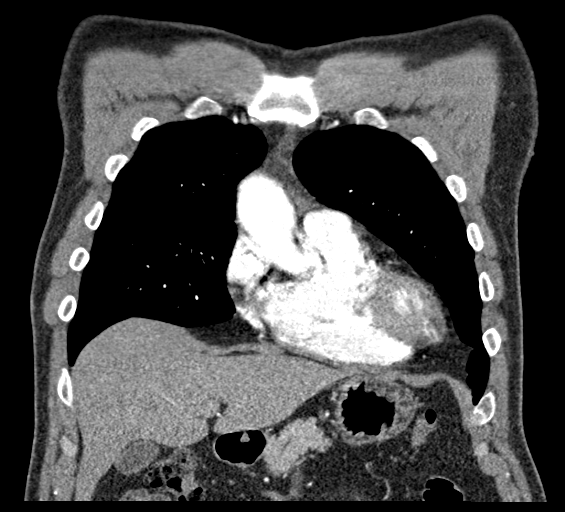
[im 60/120  soft-tissue]
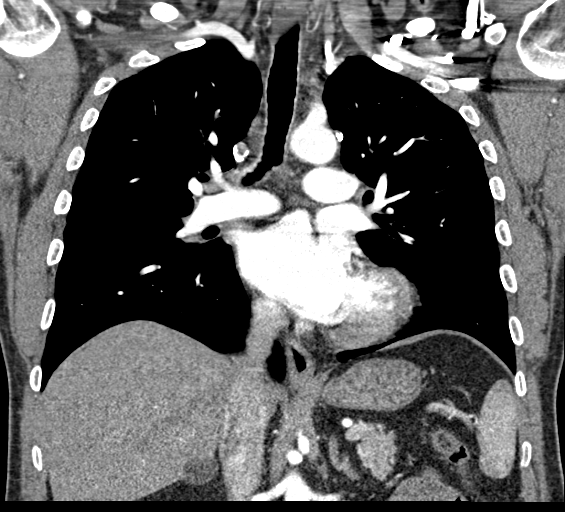
[im 90/120  soft-tissue]
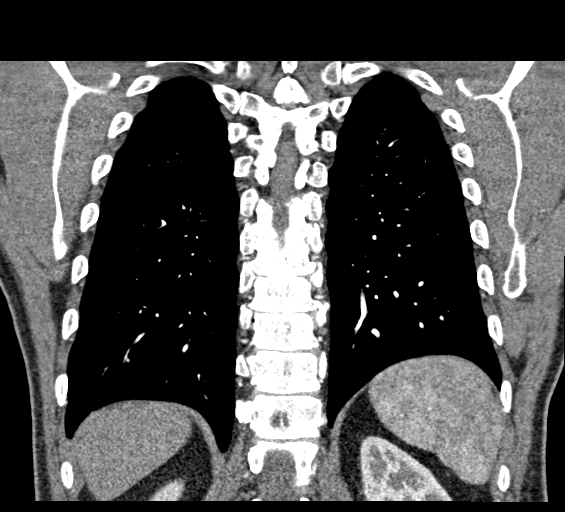

[18 of 46 positions shown; findings below may reference images not displayed]

RADIATION DOSE REDUCTION: This exam was performed according to the
departmental dose-optimization program which includes automated
exposure control, adjustment of the mA and/or kV according to
patient size and/or use of iterative reconstruction technique.

CONTRAST:  100mL OMNIPAQUE IOHEXOL 350 MG/ML SOLN
FINDINGS: Cardiovascular: Ascending aortic aneurysm measures 4.5 x 4.3 cm
axial image 4/48. No evidence for dissection. Heart is normal in
size. No pericardial effusion. No central pulmonary embolism.

Mediastinum/Nodes: No enlarged mediastinal, hilar, or axillary lymph
nodes. Thyroid gland, trachea, and esophagus demonstrate no
significant findings.

Lungs/Pleura: There are 2 ill-defined slightly nodular densities in
the inferior right upper lobe measuring up to 4 mm image 5/47. These
are new from prior study. The lungs are otherwise clear. No pleural
effusion or pneumothorax identified.

Upper Abdomen: There is a 2 cm right hepatic cyst similar to the
prior study.

Musculoskeletal: Multilevel degenerative changes affect the spine.

Review of the MIP images confirms the above findings.
IMPRESSION: 1. Ascending aortic aneurysm now measures 4.5 cm (previously
cm). Ascending thoracic aortic aneurysm. Recommend semi-annual
imaging followup by CTA or MRA and referral to cardiothoracic
surgery if not already obtained. This recommendation follows 5676
ACCF/AHA/AATS/ACR/ASA/SCA/ZEINAB/TIGER/LUVO/GAREWAN Guidelines for the
Diagnosis and Management of Patients With Thoracic Aortic Disease.
Circulation. 5676; 121: E266-e369. Aortic aneurysm NOS (IMOCJ-SIO.7)
2. Pathology: Multiple pulmonary nodules. Most severe: 4 mm
suspicious right solid pulmonary nodule within the upper lobe. If
patient is low risk for malignancy, no routine follow-up imaging is
recommended; if patient is high risk for malignancy, a non-contrast
Chest CT at 12 months is optional. If performed and the nodule is
stable at 12 months, no further follow-up is recommended. These
guidelines do not apply to immunocompromised patients and patients
with cancer. Follow up in patients with significant comorbidities as
clinically warranted. For lung cancer screening, adhere to Lung-RADS
guidelines. Reference: Radiology. 1136; 284(1):228-43.

## 2022-06-12 MED ORDER — IOHEXOL 350 MG/ML SOLN
100.0000 mL | Freq: Once | INTRAVENOUS | Status: AC | PRN
Start: 2022-06-12 — End: 2022-06-12
  Administered 2022-06-12: 75 mL via INTRAVENOUS

## 2022-06-17 ENCOUNTER — Other Ambulatory Visit: Payer: Self-pay | Admitting: *Deleted

## 2022-06-17 NOTE — Progress Notes (Signed)
Open error 

## 2022-06-18 ENCOUNTER — Telehealth: Payer: Self-pay | Admitting: *Deleted

## 2022-06-18 NOTE — Telephone Encounter (Signed)
Called and left message on wife's voicemail per request, to offer appointment-( 06/23/22 at 2:20 pm )   patient's husband was due to be seen in April 2024 .

## 2022-06-20 NOTE — Telephone Encounter (Signed)
Left message on both voicemail to accept appt if no answer prior to 4 pm today will release appt timeslot

## 2022-06-23 ENCOUNTER — Ambulatory Visit: Payer: Commercial Managed Care - HMO | Attending: Cardiology | Admitting: Cardiology

## 2022-06-23 ENCOUNTER — Encounter: Payer: Self-pay | Admitting: Cardiology

## 2022-06-23 VITALS — BP 104/72 | HR 95 | Ht 73.0 in | Wt 203.2 lb

## 2022-06-23 DIAGNOSIS — E785 Hyperlipidemia, unspecified: Secondary | ICD-10-CM | POA: Diagnosis not present

## 2022-06-23 DIAGNOSIS — I77810 Thoracic aortic ectasia: Secondary | ICD-10-CM

## 2022-06-23 DIAGNOSIS — I358 Other nonrheumatic aortic valve disorders: Secondary | ICD-10-CM | POA: Diagnosis not present

## 2022-06-23 DIAGNOSIS — I341 Nonrheumatic mitral (valve) prolapse: Secondary | ICD-10-CM | POA: Diagnosis not present

## 2022-06-23 NOTE — Patient Instructions (Signed)

## 2022-06-23 NOTE — Progress Notes (Signed)
Primary Care Provider: Farris Has, MD [facility] Cardiologist: Bryan Lemma, MD Electrophysiologist: None  Clinic Note: Chief Complaint  Patient presents with   Follow-up    Delayed 53-month.  Reviewed results of chest CTA    ===================================  ASSESSMENT/PLAN   Problem List Items Addressed This Visit       Cardiology Problems   Mild mitral valve prolapse (Chronic)    Can probably plan for follow-up echocardiogram after annual follow-up.  I do not hear any mitral regurgitation on exam.      Relevant Orders   EKG 12-Lead (Completed)   Dyslipidemia, goal LDL below 100 (Chronic)    Labs recently checked showed LDL of 70.  Outstanding on low-dose rosuvastatin 10 mg daily. He did not tolerate 20 mg.  But with current levels, well-controlled.  Continue 10 mg.      Dilatation of thoracic aorta (HCC) - Primary (Chronic)    1 year follow-up CTA showed stable 4.3 mm ascending aorta.  Comparable to previous studies.  The there have been 1 that said was 4.5, but that was probably misrepresentation.  At this point we have had several studies that have been stable.  Concerning level of thyroid expansion would be closer to 5 cm.  I think we can probably go out to every other year.  Will see him back next year to order follow-up study for April 2026.  Continue to treat lipids and blood pressure.      Relevant Orders   EKG 12-Lead (Completed)     Other   Systolic murmur of aorta    Likely related to aortic sclerosis.  Can reassess an echo after next visit.      ===================================  HPI:    Charles Skinner is a 52 y.o. male with a PMH below who presents today for delayed 87-month follow-up to discuss results of his CTA chest..  Charles Skinner was last seen on November 19, 2021 for follow-up evaluation of dilated thoracic aorta and mild mitral prolapse.  Doing well with no major cardiac issues.  Limited by knee and back pain that kept him  from being active.  Otherwise asymptomatic.  1/6 SEM but no HSM noted on exam.=> Plan was already in place for follow-up CTA chest-aorta in May 2024.  Recent Hospitalizations: None  Reviewed  CV studies:    The following studies were reviewed today: (if available, images/films reviewed: From Epic Chart or Care Everywhere) 06/12/22:  CT A Aorta -> Ascending thoracic aortic aneurysm again noted, measuring maximally 4.3 cm compared to 4.5 cm previously. No dissection. Descending thoracic aorta normal caliber. No visible pulmonary embolus. Heart normal size.  Interval History:   CAS Charles Skinner returns here today for delayed 58-month follow-up doing markedly well.  He is very happy to hear the results of his CT scan showing stable findings.  Still limited by bilateral knee pain as well as back pain but staying active.  No active cardiac symptoms of chest pain pressure or dyspnea aggressive exertion.  No PND orthopnea edema.  No irregular beats palpitations.  No syncope or near syncope and no claudication.  No TIA/CVA or amaurosis fugax.  No symptoms to suggest subclavian steal.  REVIEWED OF SYSTEMS   Review of Systems  Constitutional:  Negative for malaise/fatigue and weight loss.  Respiratory:  Negative for shortness of breath.   Gastrointestinal:  Negative for blood in stool, heartburn (Doing better.  No longer taking p.o.) and melena.  Genitourinary:  Negative for  hematuria.  Musculoskeletal:  Positive for back pain and joint pain (Bilateral knee pain-takes turmeric).  Neurological:  Negative for dizziness and focal weakness.  Psychiatric/Behavioral:  The patient is not nervous/anxious (Doing better).    I have reviewed and (if needed) personally updated the patient's problem list, medications, allergies, past medical and surgical history, social and family history.   PAST MEDICAL HISTORY   Past Medical History:  Diagnosis Date   Ascending aortic aneurysm (HCC) 02/2012   per last CTA  03-2017 --  TAA  4.3cm   (monitored by dr Ridge Lafond (cardiologist)   Bilateral inguinal hernia    History of Helicobacter pylori infection    2014-- resolved w/ antibiotics   History of palpitations    2014--  resolved   Mild obstructive sleep apnea    PER PT NO CPAP RECOMMENDATION    PAST SURGICAL HISTORY   Past Surgical History:  Procedure Laterality Date   ANTERIOR CRUCIATE LIGAMENT REPAIR Left 1996   INGUINAL HERNIA REPAIR N/A 10/17/2015   Procedure: LAPAROSCOPIC BILATERAL INGUINAL HERNIA REPAIR;  Surgeon: Rodman Pickle, MD;  Location: [facility];  Service: General;  Laterality: N/A;   INSERTION OF MESH Bilateral 10/17/2015   Procedure: INSERTION OF MESH;  Surgeon: De Blanch Kinsinger, MD;  Location: [facility];  Service: General;  Laterality: Bilateral;   TRANSTHORACIC ECHOCARDIOGRAM  04/24/2020   EF 60 to 65%.  Mild LVH.  GR 1 DD.  Normal RV.  Mild holosystolic prolapse of P2 scallop.  Aortic valve calcification/thickening (sclerosis) but no stenosis.  Dilated ascending aorta-estimated 4.5 cm.    MEDICATIONS/ALLERGIES   Current Meds  Medication Sig   Cyanocobalamin (VITAMIN B 12 PO) Take 1 capsule by mouth daily in the afternoon.   rosuvastatin (CRESTOR) 10 MG tablet Take 1 tablet (10 mg total) by mouth daily.   Vitamin D, Ergocalciferol, (DRISDOL) 1.25 MG (50000 UT) CAPS capsule Take 5,000 Units by mouth daily.    No Known Allergies  SOCIAL HISTORY/FAMILY HISTORY   Reviewed in Epic:  Pertinent findings: Non-smoker, no alcohol.  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    OBJCTIVE -PE, EKG, labs   Wt Readings from Last 3 Encounters:  06/26/22 202 lb (91.6 kg)  06/23/22 203 lb 3.2 oz (92.2 kg)  11/19/21 205 lb 3.2 oz (93.1 kg)    Physical Exam: BP 104/72 (BP Location: Left Arm, Patient Position: Sitting,  Cuff Size: Normal)   Pulse 95   Ht 6\' 1"  (1.854 m)   Wt 203 lb 3.2 oz (92.2 kg)   SpO2 95%   BMI 26.81 kg/m  Physical Exam Vitals reviewed.  Constitutional:      General: He is not in acute distress.    Appearance: Normal appearance. He is normal weight. He is not ill-appearing or toxic-appearing.  HENT:     Head: Normocephalic and atraumatic.  Neck:     Vascular: No carotid bruit.  Cardiovascular:     Rate and Rhythm: Normal rate and regular rhythm.     Pulses: Normal pulses.     Heart sounds: Murmur (Soft 1/6 SEM at RUSB) heard.     No friction rub.  Pulmonary:     Effort: Pulmonary effort is normal. No respiratory distress.     Breath sounds: Normal breath sounds. No wheezing or rales.  Chest:  Chest wall: No tenderness.  Musculoskeletal:        General: No swelling. Normal range of motion.     Cervical back: Normal range of motion and neck supple.  Skin:    General: Skin is warm and dry.  Neurological:     General: No focal deficit present.     Mental Status: He is alert and oriented to person, place, and time.  Psychiatric:        Mood and Affect: Mood normal.        Behavior: Behavior normal.        Thought Content: Thought content normal.        Judgment: Judgment normal.      Adult ECG Report  Rate: 95;  Rhythm: normal sinus rhythm and left atrial enlargement, otherwise normal axis, intervals and durations. ;   Narrative Interpretation: Stable  Recent Labs: Reviewed Lab Results  Component Value Date   CHOL 129 03/28/2022   HDL 46 03/28/2022   LDLCALC 70 03/28/2022   TRIG 63 03/28/2022   CHOLHDL 2.8 03/28/2022   Lab Results  Component Value Date   CREATININE 1.07 10/16/2021   BUN 23 10/16/2021   NA 140 10/16/2021   K 4.4 10/16/2021   CL 104 10/16/2021   CO2 22 10/16/2021      Latest Ref Rng & Units 10/17/2015    7:58 AM 08/14/2012    5:25 PM 03/06/2012    7:39 AM  CBC  WBC 4.0 - 10.5 K/uL  7.5  9.4   Hemoglobin 13.0 - 17.0 g/dL 16.1  09.6   04.5   Hematocrit 39.0 - 52.0 %  42.4  46.1   Platelets 150 - 400 K/uL  188  157     Lab Results  Component Value Date   HGBA1C 5.4 03/06/2012   Lab Results  Component Value Date   TSH 2.082 03/06/2012    ================================================== I spent a total of 20 minutes with the patient spent in direct patient consultation.  Additional time spent with chart review  / charting (studies, outside notes, etc): 14 min Total Time: 34 min  Current medicines are reviewed at length with the patient today.  (+/- concerns) N/A  Notice: This dictation was prepared with Dragon dictation along with smart phrase technology. Any transcriptional errors that result from this process are unintentional and may not be corrected upon review.  Studies Ordered:   Orders Placed This Encounter  Procedures   EKG 12-Lead   No orders of the defined types were placed in this encounter.   Patient Instructions / Medication Changes & Studies & Tests Ordered   Patient Instructions  Medication Instructions:   No changes  *If you need a refill on your cardiac medications before your next appointment, please call your pharmacy*   Lab Work: Not needed    Testing/Procedures:  Not needed  Follow-Up: At [facility], you and your health needs are our priority.  As part of our continuing mission to provide you with exceptional heart care, we have created designated Provider Care Teams.  These Care Teams include your primary Cardiologist (physician) and Advanced Practice Providers (APPs -  Physician Assistants and Nurse Practitioners) who all work together to provide you with the care you need, when you need it.     Your next appointment:   12 month(s)  The format for your next appointment:   In Person  Provider:   Bryan Lemma, MD    Other Instructions  Leonie Man, MD, MS Glenetta Hew, M.D., M.S. Interventional Cardiologist  [place]  Pager #  [phone] Phone # [phone] [address] [place], [place] [address]   Thank you for choosing [facility]!!

## 2022-06-26 ENCOUNTER — Encounter: Payer: Self-pay | Admitting: Pulmonary Disease

## 2022-06-26 ENCOUNTER — Ambulatory Visit: Payer: Commercial Managed Care - HMO | Admitting: Pulmonary Disease

## 2022-06-26 VITALS — BP 120/80 | HR 86 | Ht 73.0 in | Wt 202.0 lb

## 2022-06-26 DIAGNOSIS — R918 Other nonspecific abnormal finding of lung field: Secondary | ICD-10-CM

## 2022-06-26 NOTE — Patient Instructions (Signed)
Thank you for visiting Dr. Tonia Brooms at [facility]. Today we recommend the following:  Orders Placed This Encounter  Procedures   CT Chest Wo Contrast   Return in about 3 months (around 09/26/2022) for with Kandice Robinsons, NP, after CT Chest.    Please do your part to reduce the spread of COVID-19.

## 2022-06-26 NOTE — Progress Notes (Signed)
Synopsis: Referred in June 2024 for abnormal CT chest by Farris Has, MD  Subjective:   PATIENT ID: Charles Skinner GENDER: male DOB: [birth year], MRN: [id number]  Chief Complaint  Patient presents with   Consult    Consult for lung nodule.    This is a 52 year old gentleman, past medical history of an ascending thoracic aneurysm, history of H. pylori, mild obstructive sleep apnea.  Was referred after having CT of the chest for monitoring of his ascending thoracic aneurysm.  This revealed on the CTA area of clustered nodular findings in the posterior right upper lobe likely an inflammatory lesion versus early pneumonia.  Patient was referred here for follow-up after CT imaging complete.  From respiratory standpoint patient has no complaints.  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    Past Medical History:  Diagnosis Date   Ascending aortic aneurysm (HCC) 02/2012   per last CTA 03-2017 --  TAA  4.3cm   (monitored by dr harding (cardiologist)   Bilateral inguinal hernia    History of Helicobacter pylori infection    2014-- resolved w/ antibiotics   History of palpitations    2014--  resolved   Mild obstructive sleep apnea    PER PT NO CPAP RECOMMENDATION     Family History  Problem Relation Age of Onset   Heart attack Mother 75     Past Surgical History:  Procedure Laterality Date   ANTERIOR CRUCIATE LIGAMENT REPAIR Left 1996   INGUINAL HERNIA REPAIR N/A 10/17/2015   Procedure: LAPAROSCOPIC BILATERAL INGUINAL HERNIA REPAIR;  Surgeon: Rodman Pickle, MD;  Location[facility];  Service: General;  Laterality: N/A;   INSERTION OF MESH Bilateral 10/17/2015   Procedure: INSERTION OF MESH;  Surgeon: De Blanch Kinsinger, MD;  Location: [facility];  Service: General;  Laterality: Bilateral;   TRANSTHORACIC ECHOCARDIOGRAM   04/24/2020   EF 60 to 65%.  Mild LVH.  GR 1 DD.  Normal RV.  Mild holosystolic prolapse of P2 scallop.  Aortic valve calcification/thickening (sclerosis) but no stenosis.  Dilated ascending aorta-estimated 4.5 cm.    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   Social Determinants of Health   Financial Resource Strain: Not on file  Food Insecurity: Not on file  Transportation Needs: Not on file  Physical Activity: Not on file  Stress: Not on file  Social Connections: Not on file  Intimate Partner Violence: Not on file     No Known Allergies   Outpatient Medications Prior to Visit  Medication Sig Dispense Refill   Coenzyme Q10 (CO Q 10 PO) Take 1 capsule by mouth daily.     Cyanocobalamin (VITAMIN  B 12 PO) Take 1 capsule by mouth daily in the afternoon.     rosuvastatin (CRESTOR) 10 MG tablet Take 1 tablet (10 mg total) by mouth daily. 90 tablet 3   Vitamin D, Ergocalciferol, (DRISDOL) 1.25 MG (50000 UT) CAPS capsule Take 5,000 Units by mouth daily.     FIBER ADULT GUMMIES PO Take by mouth. (Patient not taking: Reported on 11/19/2021)     Multiple Vitamins-Minerals (CENTRUM MEN) TABS Take by mouth. (Patient not taking: Reported on 11/19/2021)     omeprazole (PRILOSEC) 40 MG capsule Take 40 mg by mouth daily. (Patient not taking: Reported on 11/19/2021)     TURMERIC PO Take by mouth. (Patient not taking: Reported on 11/19/2021)     No  facility-administered medications prior to visit.    Review of Systems  Constitutional:  Negative for chills, fever, malaise/fatigue and weight loss.  HENT:  Negative for hearing loss, sore throat and tinnitus.   Eyes:  Negative for blurred vision and double vision.  Respiratory:  Negative for cough, hemoptysis, sputum production, shortness of breath, wheezing and stridor.   Cardiovascular:  Negative for chest pain, palpitations, orthopnea, leg swelling and PND.  Gastrointestinal:  Negative for abdominal pain, constipation, diarrhea, heartburn, nausea and vomiting.  Genitourinary:  Negative for dysuria, hematuria and urgency.  Musculoskeletal:  Negative for joint pain and myalgias.  Skin:  Negative for itching and rash.  Neurological:  Negative for dizziness, tingling, weakness and headaches.  Endo/Heme/Allergies:  Negative for environmental allergies. Does not bruise/bleed easily.  Psychiatric/Behavioral:  Negative for depression. The patient is not nervous/anxious and does not have insomnia.   All other systems reviewed and are negative.    Objective:  Physical Exam Vitals reviewed.  Constitutional:      General: He is not in acute distress.    Appearance: He is well-developed.  HENT:     Head: Normocephalic and atraumatic.  Eyes:     General: No scleral icterus.    Conjunctiva/sclera: Conjunctivae normal.     Pupils: Pupils are equal, round, and reactive to light.  Neck:     Vascular: No JVD.     Trachea: No tracheal deviation.  Cardiovascular:     Rate and Rhythm: Normal rate and regular rhythm.     Heart sounds: Normal heart sounds. No murmur heard. Pulmonary:     Effort: Pulmonary effort is normal. No tachypnea, accessory muscle usage or respiratory distress.     Breath sounds: No stridor. No wheezing, rhonchi or rales.  Abdominal:     General: There is no distension.     Palpations: Abdomen is soft.     Tenderness: There is no abdominal tenderness.   Musculoskeletal:        General: No tenderness.     Cervical back: Neck supple.  Lymphadenopathy:     Cervical: No cervical adenopathy.  Skin:    General: Skin is warm and dry.     Capillary Refill: Capillary refill takes less than 2 seconds.     Findings: No rash.  Neurological:     Mental Status: He is alert and oriented to person, place, and time.  Psychiatric:        Behavior: Behavior normal.      Vitals:   06/26/22 1609  BP: 120/80  Pulse: 86  SpO2: 96%  Weight: 202 lb (91.6 kg)  Height: 6\' 1"  (1.854 m)   96% on RA BMI Readings from Last 3 Encounters:  06/26/22 26.65 kg/m  06/23/22 26.81 kg/m  11/19/21 27.07 kg/m   Wt Readings from Last 3 Encounters:  06/26/22 202 lb (91.6 kg)  06/23/22 203 lb 3.2 oz (92.2 kg)  11/19/21 205 lb 3.2 oz (93.1 kg)     CBC    Component Value Date/Time   WBC 7.5 08/14/2012 1725   RBC 4.96 08/14/2012 1725   HGB 15.7 10/17/2015 0758   HCT 42.4 08/14/2012 1725   PLT 188 08/14/2012 1725   MCV 85.5 08/14/2012 1725   MCH 31.3 08/14/2012 1725   MCHC 36.6 (H) 08/14/2012 1725   RDW 12.1 08/14/2012 1725   LYMPHSABS 2.6 08/14/2012 1725   MONOABS 0.5 08/14/2012 1725   EOSABS 0.1 08/14/2012 1725   BASOSABS 0.0 08/14/2012 1725     Chest Imaging: CT chest May 2024: Right upper lobe posterior groundglass and small nodular clustered area concerning for early inflammatory lesions. The patient's images have been independently reviewed by me.    Pulmonary Functions Testing Results:     No data to display          FeNO:   Pathology:   Echocardiogram:   Heart Catheterization:     Assessment & Plan:     ICD-10-CM   1. Multiple pulmonary nodules  R91.8       Discussion:  This is a 52 year old gentleman no family history of lung cancer, lifelong non-smoker.  Found to have pulmonary nodules on CT imaging last year.  Those have resolved but follow-up CT imaging shows new nodules in the chest he also has some areas of  groundglass associated with them suggestive of inflammatory lesion.  Patient has no family history of lung malignancy.  Otherwise low risk.  P: We discussed his risk assessment today. Would recommend repeat CT imaging in 3 months to ensure clearance of abnormalities that was seen on CT imaging. A specter dealing with an inflammatory lesion or pneumonitis or early bronchopneumonia at that area that is likely cleared since he is asymptomatic now.  Patient to follow-up with [place] in 3 months after CT chest complete can see SG, NP after CT chest complete.   Current Outpatient Medications:    Coenzyme Q10 (CO Q 10 PO), Take 1 capsule by mouth daily., Disp: , Rfl:    Cyanocobalamin (VITAMIN B 12 PO), Take 1 capsule by mouth daily in the afternoon., Disp: , Rfl:    rosuvastatin (CRESTOR) 10 MG tablet, Take 1 tablet (10 mg total) by mouth daily., Disp: 90 tablet, Rfl: 3   Vitamin D, Ergocalciferol, (DRISDOL) 1.25 MG (50000 UT) CAPS capsule, Take 5,000 Units by mouth daily., Disp: , Rfl:    Josephine Igo, DO [facility] 06/26/2022 4:20 PM

## 2022-07-06 ENCOUNTER — Encounter: Payer: Self-pay | Admitting: Cardiology

## 2022-07-06 NOTE — Assessment & Plan Note (Addendum)
Can probably plan for follow-up echocardiogram after annual follow-up.  I do not hear any mitral regurgitation on exam.

## 2022-07-06 NOTE — Assessment & Plan Note (Signed)
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  Can reassess an echo after next visit.

## 2022-07-06 NOTE — Assessment & Plan Note (Signed)
1 year follow-up CTA showed stable 4.3 mm ascending aorta.  Comparable to previous studies.  The there have been 1 that said was 4.5, but that was probably misrepresentation.  At this point we have had several studies that have been stable.  Concerning level of thyroid expansion would be closer to 5 cm.  I think we can probably go out to every other year.  Will see him back next year to order follow-up study for April 2026.  Continue to treat lipids and blood pressure.

## 2022-07-06 NOTE — Assessment & Plan Note (Signed)
Labs recently checked showed LDL of 70.  Outstanding on low-dose rosuvastatin 10 mg daily. He did not tolerate 20 mg.  But with current levels, well-controlled.  Continue 10 mg.

## 2022-08-01 ENCOUNTER — Telehealth: Payer: Self-pay | Admitting: Pulmonary Disease

## 2022-08-01 NOTE — Telephone Encounter (Signed)
Patient wants to know if his CT was scheduled at [facility] for a reason? he wanted something in [place] since it is closer to him. Would this be a timing issue and thats why it was scheduled at [place]? Please advise on CT being scheduled in [place] and call patient back.

## 2022-08-01 NOTE — Telephone Encounter (Signed)
Pt aware of appt date and time

## 2022-08-27 ENCOUNTER — Encounter: Payer: Self-pay | Admitting: Pulmonary Disease

## 2022-09-18 ENCOUNTER — Encounter: Payer: Self-pay | Admitting: Pulmonary Disease

## 2022-09-18 ENCOUNTER — Telehealth: Payer: Self-pay | Admitting: Pulmonary Disease

## 2022-09-18 NOTE — Telephone Encounter (Signed)
See prev encounter

## 2022-09-18 NOTE — Telephone Encounter (Signed)
patient came in today because he has to get a CT order cancelled. His insurance sent in a letter stating there is a duplicate CT. One was ordered in July and the other was submitted on Aug 7th and only one is needed. Sent Dr. Tonia Brooms a secure chat on this pt and put a copy of the letter in his mailbox

## 2022-09-23 ENCOUNTER — Other Ambulatory Visit (HOSPITAL_COMMUNITY): Payer: Commercial Managed Care - HMO

## 2022-09-23 ENCOUNTER — Ambulatory Visit
Admission: RE | Admit: 2022-09-23 | Discharge: 2022-09-23 | Disposition: A | Discharge status: Home / Self Care | Payer: Commercial Managed Care - HMO | Source: Ambulatory Visit | Attending: Pulmonary Disease | Admitting: Pulmonary Disease

## 2022-09-23 DIAGNOSIS — R918 Other nonspecific abnormal finding of lung field: Secondary | ICD-10-CM

## 2022-09-30 ENCOUNTER — Ambulatory Visit: Payer: Commercial Managed Care - HMO | Admitting: Acute Care

## 2022-09-30 ENCOUNTER — Encounter: Payer: Self-pay | Admitting: Acute Care

## 2022-09-30 VITALS — BP 112/70 | HR 90 | Ht 73.0 in | Wt 202.6 lb

## 2022-09-30 DIAGNOSIS — J302 Other seasonal allergic rhinitis: Secondary | ICD-10-CM

## 2022-09-30 DIAGNOSIS — R918 Other nonspecific abnormal finding of lung field: Secondary | ICD-10-CM | POA: Diagnosis not present

## 2022-09-30 NOTE — Patient Instructions (Addendum)
It is good to see you today. Your Ct Chest shows some partial resolution of the scattered mild perihilar ground-glass opacities in the posterior right upper lobe, favor infectious/inflammatory. We will do a 6 month follow up Ct To ensure complete resolution. This will be due 03/2024. We will call you closer to the time to get this scheduled.  We will do a 6 month follow up video visit to review results.  I have placed a referral to asthma and allergy of [place]. You will get a call to get this scheduled.  Call us if you need anything prior to your 6 month follow up. Please contact office for sooner follow up if symptoms do not improve or worsen or seek emergency care

## 2022-09-30 NOTE — Progress Notes (Signed)
History of Present Illness Charles Skinner is a 52 y.o. male never smoker referred 06/2022 for abnormal CT Chest by Dr. Farris Has, MD. He was seen by Charles Skinner  Synopsis 52 year old never smoker , past medical history of an ascending thoracic aneurysm, history of H. pylori, mild obstructive sleep apnea. Was referred after having CT of the chest for monitoring of his ascending thoracic aneurysm. This revealed on the CTA area of clustered nodular findings in the posterior right upper lobe likely an inflammatory lesion versus early pneumonia. Patient was referred here for follow-up after CT imaging complete. From respiratory standpoint patient Skinner no complaints. 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    09/30/2022 Pt. Presents for 3 month follow up imaging after CT imaging revealed right upper lobe posterior groundglass and small nodular clustered area concerning for early inflammatory lesions.There Skinner been some resolution of the scattered mild perihilar ground-glass opacities in the posterior right upper lobe, favor infectious/inflammatory.  He states he Skinner had no breathing issues, no productive cough, no fever . He does endorse seasonal allergies.  He is open to an allergy referral to see if maybe this could be a contributing factor to what we are seeing on his CT.  I have placed the referral to [facility] allergy, we will do a 41-month follow-up CT chest to ensure the continued resolution of this area of right upper lobe groundglass nodular inflammatory lesions.  I have asked him to come and see [place] if he Skinner any issues between now and the time that we do the 23-month follow-up CT chest.  He verbalized understanding of the above and had no further questions.  He understands he will get a call to schedule his CT closer to the time that it is due which would March 2025.  Test  Results: CT Chest 09/23/2022 Partial resolution of scattered mild perihilar ground-glass opacities in the posterior right upper lobe, favor infectious/inflammatory. Attention on follow-up scans. Ascending thoracic aortic aneurysm, as described on prior CTA. Continued surveillance as previously recommended. CT scan images personally reviewed by myself  CT chest May 2024: Right upper lobe posterior ground glass and small nodular clustered area concerning for early inflammatory lesions.    Latest Ref Rng & Units 10/17/2015    7:58 AM 08/14/2012    5:25 PM 03/06/2012    7:39 AM  CBC  WBC 4.0 - 10.5 K/uL  7.5  9.4   Hemoglobin 13.0 - 17.0 g/dL 62.9  52.8  41.3   Hematocrit 39.0 - 52.0 %  42.4  46.1   Platelets 150 - 400 K/uL  188  157        Latest Ref Rng & Units 10/16/2021   12:00 AM 06/11/2020    8:47 AM 03/27/2017    2:56 PM  BMP  Glucose 70 - 99 mg/dL 88  96  77   BUN 6 - 24 mg/dL 23  16  16    Creatinine 0.76 - 1.27 mg/dL 2.44  0.10  2.72   BUN/Creat Ratio 9 - 20 21  17  16    Sodium 134 - 144 mmol/L 140  140  143   Potassium 3.5 - 5.2 mmol/L 4.4  4.2  4.1   Chloride 96 - 106 mmol/L 104  105  103   CO2 20 - 29 mmol/L 22  23  24   Calcium 8.7 - 10.2 mg/dL 9.2  9.2  9.4     BNP No results found for: "BNP"  ProBNP No results found for: "PROBNP"  PFT No results found for: "FEV1PRE", "FEV1POST", "FVCPRE", "FVCPOST", "TLC", "DLCOUNC", "PREFEV1FVCRT", "PSTFEV1FVCRT"  CT Chest Wo Contrast  Result Date: 09/29/2022 CLINICAL DATA:  Annual lung nodule follow-up EXAM: CT CHEST WITHOUT CONTRAST TECHNIQUE: Multidetector CT imaging of the chest was performed following the standard protocol without IV contrast. RADIATION DOSE REDUCTION: This exam was performed according to the departmental dose-optimization program which includes automated exposure control, adjustment of the mA and/or kV according to patient size and/or use of iterative reconstruction technique. COMPARISON:  06/12/2022 and  previous FINDINGS: Cardiovascular: Heart size normal. No pericardial effusion. Dilated ascending thoracic aorta estimated 4.3 cm, incompletely evaluated. Central pulmonary arteries normal in caliber. Mediastinum/Nodes: No mediastinal hematoma, mass, or adenopathy. Lungs/Pleura: No pleural effusion. No pneumothorax. Partial resolution of scattered mild perihilar ground-glass opacities in the posterior right upper lobe. left lung remains clear. Upper Abdomen: 2.9 cm cyst in the caudate lobe of the liver, slowly increasing since 03/31/2017. No acute findings. Musculoskeletal: Vertebral endplate spurring at multiple levels in the mid and lower thoracic spine. IMPRESSION: 1. Partial resolution of scattered mild perihilar ground-glass opacities in the posterior right upper lobe, favor infectious/inflammatory. Attention on follow-up scans. 2. Ascending thoracic aortic aneurysm, as described on prior CTA. Continued surveillance as previously recommended. Electronically Signed   By: Charles Skinner M.D.   On: 09/29/2022 12:31     Past medical hx Past Medical History:  Diagnosis Date   Ascending aortic aneurysm (HCC) 02/2012   per last CTA 03-2017 --  TAA  4.3cm   (monitored by dr Herbie [place] (cardiologist)   Bilateral inguinal hernia    History of Helicobacter pylori infection    2014-- resolved w/ antibiotics   History of palpitations    2014--  resolved   Mild obstructive sleep apnea    PER PT NO CPAP RECOMMENDATION     Social History   Tobacco Use   Smoking status: Never   Smokeless tobacco: Never  Substance Use Topics   Alcohol use: No   Drug use: No    [name] reports that he Skinner never smoked. He Skinner never used smokeless tobacco. He reports that he does not drink alcohol and does not use drugs.  Tobacco Cessation: Never smoker   Past surgical hx, Family hx, Social hx all reviewed.  Current Outpatient Medications on File Prior to Visit  Medication Sig   Coenzyme Q10 (CO Q 10 PO) Take 1 capsule  by mouth daily.   Cyanocobalamin (VITAMIN B 12 PO) Take 1 capsule by mouth daily in the afternoon.   rosuvastatin (CRESTOR) 10 MG tablet Take 1 tablet (10 mg total) by mouth daily.   Vitamin D, Ergocalciferol, (DRISDOL) 1.25 MG (50000 UT) CAPS capsule Take 5,000 Units by mouth daily.   No current facility-administered medications on file prior to visit.     No Known Allergies  Review Of Systems:  Constitutional:   No  weight loss, night sweats,  Fevers, chills, fatigue, or  lassitude.  HEENT:   No headaches,  Difficulty swallowing,  Tooth/dental problems, or  Sore throat,                No sneezing, itching, ear ache, nasal congestion, post nasal drip,   CV:  No chest pain,  Orthopnea, PND, swelling in lower extremities, anasarca, dizziness, palpitations, syncope.  GI  No heartburn, indigestion, abdominal pain, nausea, vomiting, diarrhea, change in bowel habits, loss of appetite, bloody stools.   Resp: No shortness of breath with exertion or at rest.  No excess mucus, no productive cough,  No non-productive cough,  No coughing up of blood.  No change in color of mucus.  No wheezing.  No chest wall deformity, + seasonal allergies  Skin: no rash or lesions.  GU: no dysuria, change in color of urine, no urgency or frequency.  No flank pain, no hematuria   MS:  No joint pain or swelling.  No decreased range of motion.  No back pain.  Psych:  No change in mood or affect. No depression or anxiety.  No memory loss.   Vital Signs BP 112/70   Pulse 90   Ht 6\' 1"  (1.854 m)   Wt 202 lb 9.6 oz (91.9 kg)   SpO2 97%   BMI 26.73 kg/m    Physical Exam:  General- No distress,  A&Ox3, pleasant ENT: No sinus tenderness, TM clear, pale nasal mucosa, no oral exudate,no post nasal drip, no LAN Cardiac: S1, S2, regular rate and rhythm, no murmur Chest: No wheeze/ rales/ dullness; no accessory muscle use, no nasal flaring, no sternal retractions Abd.: Soft Non-tender, ND, BS +, Body mass  index is 26.73 kg/m.  Ext: No clubbing cyanosis, edema Neuro:  normal strength, MAE x 4, A&O x 3 Skin: No rashes, warm and dry, no lesions  Psych: normal mood and behavior   Assessment/Plan Multiple pulmonary nodules Partial resolution on follow-up imaging, leaning more toward inflammatory or infectious etiology Plan Your Ct Chest shows some partial resolution of the scattered mild perihilar ground-glass opacities in the posterior right upper lobe, favor infectious/inflammatory. We will do a 6 month follow up Ct To ensure complete resolution. This will be due 03/2024. We will call you closer to the time to get this scheduled.  We will do a 6 month follow up video visit to review results.  I have placed a referral to asthma and allergy of [place]. You will get a call to get this scheduled.  Call us if you need anything prior to your 6 month follow up. Please contact office for sooner follow up if symptoms do not improve or worsen or seek emergency care    I spent 20 minutes dedicated to the care of this patient on the date of this encounter to include pre-visit review of records, face-to-face time with the patient discussing conditions above, post visit ordering of testing, clinical documentation with the electronic health record, making appropriate referrals as documented, and communicating necessary information to the patient's healthcare team.      Bevelyn Ngo, NP 09/30/2022  8:53 AM

## 2022-10-02 NOTE — Progress Notes (Signed)
Reviewed at last office visit   Thanks,  BLI  Josephine Igo, DO [facility] 10/02/2022 12:52 PM

## 2022-10-06 ENCOUNTER — Ambulatory Visit (HOSPITAL_BASED_OUTPATIENT_CLINIC_OR_DEPARTMENT_OTHER): Payer: Commercial Managed Care - HMO

## 2022-10-09 ENCOUNTER — Encounter: Payer: Self-pay | Admitting: Allergy

## 2022-10-09 ENCOUNTER — Ambulatory Visit: Payer: Commercial Managed Care - HMO | Admitting: Allergy

## 2022-10-09 VITALS — BP 120/72 | HR 75 | Temp 98.3°F | Resp 16 | Ht 72.5 in | Wt 201.0 lb

## 2022-10-09 DIAGNOSIS — R0689 Other abnormalities of breathing: Secondary | ICD-10-CM | POA: Diagnosis not present

## 2022-10-09 DIAGNOSIS — J3089 Other allergic rhinitis: Secondary | ICD-10-CM | POA: Diagnosis not present

## 2022-10-09 NOTE — Patient Instructions (Addendum)
Environmental allergies Return for allergy skin testing. No antihistamines for 3 days before. Plan on being here for 1 hour. Bring in the nasal spray you are using. Will make additional recommendations based on results.   CT chest Reviewed. Today's breathing test was normal. Keep follow up with pulmonology regarding repeat CT chest next year.   Recommend wearing masks when working with fine dust particles.   Return for skin testing.

## 2022-10-09 NOTE — Progress Notes (Signed)
New Patient Note  RE: Charles Skinner MRN: [id number] DOB: 02-Sep-1970 Date of Office Visit: 10/09/2022  Consult requested by: Bevelyn Ngo, NP Primary care provider: Farris Has, MD  Chief Complaint: allergy testing  History of Present Illness: I had the pleasure of seeing Charles Skinner for initial evaluation at the [facility] on 10/09/2022. He is a 52 y.o. male, who is referred here by Kandice Robinsons, FNP (Pulmonology) for the evaluation of asthma/allergies.  He is accompanied today by his spouse who provided/contributed to the history.   Discussed the use of AI scribe software for clinical note transcription with the patient, who gave verbal consent to proceed.  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 The scan, originally intended to monitor the patient's aortic condition, revealed an abnormality in the lungs (groundglass opacities and nodules). The patient reports no respiratory symptoms such as coughing, shortness of breath, chest tightness, or wheezing. He denies any recent respiratory infections or history of asthma.  The repeat CT scan is apparently better and scheduled to have a repeat CT in 1 year.   The patient has been experiencing intermittent sinus drainage and nasal stuffiness over the past year, which he manages with occasional Claritin and a generic nasal spray. These symptoms are more pronounced in the spring and late summer to fall. He denies any associated headaches, and reports no issues with his sense of smell.  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 He reports wearing a mask as much as possible during work.  09/30/2022 pulm visit: "Multiple pulmonary nodules Partial resolution on follow-up imaging,  leaning more toward inflammatory or infectious etiology Plan Your Ct Chest shows some partial resolution of the scattered mild perihilar ground-glass opacities in the posterior right upper lobe, favor infectious/inflammatory. We will do a 6 month follow up Ct To ensure complete resolution. This will be due 03/2024. We will call you closer to the time to get this scheduled.  We will do a 6 month follow up video visit to review results.  I have placed a referral to asthma and allergy of [place]. You will get a call to get this scheduled. "  09/23/2022 CT chest: "IMPRESSION: 1. Partial resolution of scattered mild perihilar ground-glass opacities in the posterior right upper lobe, favor infectious/inflammatory. Attention on follow-up scans. 2. Ascending thoracic aortic aneurysm, as described on prior CTA. Continued surveillance as previously recommended."  Assessment and Plan: Charles Skinner is a 52 y.o. male with: Other allergic rhinitis Reports of intermittent sinus drainage, stuffy nose, and sneezing, particularly in spring and late summer/fall. Some relief with Claritin and an unidentified nasal spray. Return for allergy skin testing. Bring in the nasal spray you are using. Will make additional recommendations based on results.   CT lung abnormalities CT scan changes (ground glass and nodules) noted during routine cardiac imaging, with improvement noted on follow-up imaging. No respiratory symptoms reported. 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  Today's spirometry was normal.  Keep follow up with pulmonology regarding repeat CT chest next year.   Return for Skin testing.  No orders of the defined types were placed in this encounter.  Lab Orders  No laboratory test(s) ordered  today   Other allergy screening: Food allergy: no Medication allergy: no Hymenoptera allergy: no Urticaria: no Eczema:no History of recurrent  infections suggestive of immunodeficency: no  Diagnostics: Spirometry:  Tracings reviewed. His effort: Good reproducible efforts. FVC: 4.60L FEV1: 3.88L, 95% predicted FEV1/FVC ratio: 84% Interpretation: Spirometry consistent with normal pattern.  Please see scanned spirometry results for details.  Past Medical History: Patient Active Problem List   Diagnosis Date Noted   Mild mitral valve prolapse 06/04/2020   Systolic murmur of aorta 05/24/2019   Dyslipidemia, goal LDL below 100 04/17/2017   Enlarged lymph node 04/15/2017   Pre-op testing 02/25/2017   Dilatation of thoracic aorta (HCC) 02/21/2012   Past Medical History:  Diagnosis Date   Ascending aortic aneurysm (HCC) 02/2012   per last CTA 03-2017 --  TAA  4.3cm   (monitored by dr harding (cardiologist)   Bilateral inguinal hernia    History of Helicobacter pylori infection    2014-- resolved w/ antibiotics   History of palpitations    2014--  resolved   Mild obstructive sleep apnea    PER PT NO CPAP RECOMMENDATION   Past Surgical History: Past Surgical History:  Procedure Laterality Date   ANTERIOR CRUCIATE LIGAMENT REPAIR Left 1996   INGUINAL HERNIA REPAIR N/A 10/17/2015   Procedure: LAPAROSCOPIC BILATERAL INGUINAL HERNIA REPAIR;  Surgeon: Rodman Pickle, MD;  Location: [facility];  Service: General;  Laterality: N/A;   INSERTION OF MESH Bilateral 10/17/2015   Procedure: INSERTION OF MESH;  Surgeon: De Blanch Kinsinger, MD;  Location: [facility];  Service: General;  Laterality: Bilateral;   TRANSTHORACIC ECHOCARDIOGRAM  04/24/2020   EF 60 to 65%.  Mild LVH.  GR 1 DD.  Normal RV.  Mild holosystolic prolapse of P2 scallop.  Aortic valve calcification/thickening (sclerosis) but no stenosis.  Dilated ascending aorta-estimated 4.5 cm.   Medication List:  Current Outpatient Medications  Medication Sig Dispense Refill   Coenzyme Q10 (CO Q 10 PO) Take 1 capsule by mouth daily.      Cyanocobalamin (VITAMIN B 12 PO) Take 1 capsule by mouth daily in the afternoon.     rosuvastatin (CRESTOR) 10 MG tablet Take 1 tablet (10 mg total) by mouth daily. 90 tablet 3   Vitamin D, Ergocalciferol, (DRISDOL) 1.25 MG (50000 UT) CAPS capsule Take 5,000 Units by mouth daily.     No current facility-administered medications for this visit.   Allergies: No Known Allergies Social History: 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   Social Determinants of Health   Financial Resource Strain: Not on file  Food Insecurity: Not on file  Transportation Needs: Not on file  Physical Activity: Not on file  Stress: Not on file  Social Connections: Not on file   Lives in a house. Smoking: denies Occupation: Copy History:  Water Damage/mildew in the house: yes Carpet in the family room: no Carpet in the bedroom: no Heating: heat pump Cooling: central Pet: no  Family History: Family History  Problem Relation Age of Onset   Heart attack Mother 15   Problem                               Relation Asthma                                   no Eczema                                no Food allergy                          no Allergic rhino conjunctivitis     no  Review of Systems  Constitutional:  Negative for appetite change, chills, fever and unexpected weight change.  HENT:  Positive for congestion, postnasal drip and  rhinorrhea.   Eyes:  Negative for itching.  Respiratory:  Negative for cough, chest tightness, shortness of breath and wheezing.   Cardiovascular:  Negative for chest pain.  Gastrointestinal:  Negative for abdominal pain.  Genitourinary:  Negative for difficulty urinating.  Skin:  Negative for rash.  Neurological:  Negative for headaches.    Objective: BP 120/72   Pulse 75   Temp 98.3 F (36.8 C) (Temporal)   Resp 16   Ht 6' 0.5" (1.842 m)   Wt 201 lb (91.2 kg)   SpO2 96%   BMI 26.89 kg/m  Body mass index is 26.89 kg/m. Physical Exam Vitals and nursing note reviewed.  Constitutional:      Appearance: Normal appearance. He is well-developed.  HENT:     Head: Normocephalic and atraumatic.     Right Ear: Tympanic membrane and external ear normal.     Left Ear: Tympanic membrane and external ear normal.     Nose: Nose normal.     Mouth/Throat:     Mouth: Mucous membranes are moist.     Pharynx: Oropharynx is clear.  Eyes:     Conjunctiva/sclera: Conjunctivae normal.  Cardiovascular:     Rate and Rhythm: Normal rate and regular rhythm.     Heart sounds: Normal heart sounds. No murmur heard.    No friction rub. No gallop.  Pulmonary:     Effort: Pulmonary effort is normal.     Breath sounds: Normal breath sounds. No wheezing, rhonchi or rales.  Musculoskeletal:     Cervical back: Neck supple.  Skin:    General: Skin is warm.     Findings: No rash.  Neurological:     Mental Status: He is alert and oriented to person, place, and time.  Psychiatric:        Behavior: Behavior normal.    The plan was reviewed with the patient/family, and all questions/concerned were addressed.  It was my pleasure to see Charles Skinner today and participate in his care. Please feel free to contact me with any questions or concerns.  Sincerely,  Wyline Mood, DO Allergy & Immunology  [facility] office: [phone] [facility] office: [phone]

## 2022-10-22 NOTE — Progress Notes (Unsigned)
   Skin testing note  RE: Charles Skinner MRN: [id number] DOB: [birth year] Date of Office Visit: 10/23/2022  Referring provider: Farris Has, MD Primary care provider: Farris Has, MD  Chief Complaint: No chief complaint on file.  History of Present Illness: I had the pleasure of seeing Charles Skinner for a skin testing visit at the [facility] on 10/22/2022. He is a 52 y.o. male, who is being followed for allergic rhinitis. His previous allergy office visit was on 10/09/2022 with Dr. Selena Batten. Today is a skin testing visit.   Other allergic rhinitis Reports of intermittent sinus drainage, stuffy nose, and sneezing, particularly in spring and late summer/fall. Some relief with Claritin and an unidentified nasal spray. Return for allergy skin testing. Bring in the nasal spray you are using. Will make additional recommendations based on results.    CT lung abnormalities CT scan changes (ground glass and nodules) noted during routine cardiac imaging, with improvement noted on follow-up imaging. No respiratory symptoms reported. 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  Today's spirometry was normal.  Keep follow up with pulmonology regarding repeat CT chest next year.    Return for Skin testing.  Assessment and Plan: Dastan is a 52 y.o. male with: ***  No follow-ups on file.  No orders of the defined types were placed in this encounter.  Lab Orders  No laboratory test(s) ordered today    Diagnostics: Spirometry:  Tracings reviewed. His effort: {Blank single:19197::"Good reproducible efforts.","It was hard to get consistent efforts and there is a question as to whether this reflects a maximal maneuver.","Poor effort, data can not be interpreted."} FVC: ***L FEV1: ***L, ***% predicted FEV1/FVC ratio: ***% Interpretation: {Blank single:19197::"Spirometry consistent with mild obstructive disease","Spirometry  consistent with moderate obstructive disease","Spirometry consistent with severe obstructive disease","Spirometry consistent with possible restrictive disease","Spirometry consistent with mixed obstructive and restrictive disease","Spirometry uninterpretable due to technique","Spirometry consistent with normal pattern","No overt abnormalities noted given today's efforts"}.  Please see scanned spirometry results for details.  Skin Testing: {Blank single:19197::"Select foods","Environmental allergy panel","Environmental allergy panel and select foods","Food allergy panel","None","Deferred due to recent antihistamines use"}. *** Results discussed with patient/family.   Previous notes and tests were reviewed. The plan was reviewed with the patient/family, and all questions/concerned were addressed.  It was my pleasure to see Charles today and participate in his care. Please feel free to contact me with any questions or concerns.  Sincerely,  Wyline Mood, DO Allergy & Immunology  [facility] office: [phone] [facility] office: [phone]

## 2022-10-23 ENCOUNTER — Ambulatory Visit: Payer: Commercial Managed Care - HMO | Admitting: Allergy

## 2022-10-23 ENCOUNTER — Encounter: Payer: Self-pay | Admitting: Allergy

## 2022-10-23 VITALS — BP 108/86 | HR 87 | Temp 98.3°F | Resp 18

## 2022-10-23 DIAGNOSIS — J301 Allergic rhinitis due to pollen: Secondary | ICD-10-CM

## 2022-10-23 DIAGNOSIS — J3089 Other allergic rhinitis: Secondary | ICD-10-CM | POA: Diagnosis not present

## 2022-10-23 MED ORDER — FLUTICASONE PROPIONATE 50 MCG/ACT NA SUSP: 1.0000 | Freq: Every day | NASAL | 5 refills | Status: DC | PRN

## 2022-10-23 NOTE — Patient Instructions (Addendum)
Today's skin testing positive to grass, weed, mold, dust mites.  Results given.  Environmental allergies Start environmental control measures as below. Recommend wearing masks when working with fine dust particles.  Use over the counter antihistamines such as Zyrtec (cetirizine), Claritin (loratadine), Allegra (fexofenadine), or Xyzal (levocetirizine) daily as needed. May take twice a day during allergy flares. May switch antihistamines every few months. Use Flonase (fluticasone) nasal spray 1-2 sprays per nostril once a day as needed for nasal congestion.  Nasal saline spray (i.e., Simply Saline) or nasal saline lavage (i.e., NeilMed) is recommended as needed and prior to medicated nasal sprays. Use oxymetazoline nasal spray sparingly as needed for severe nasal congestion. Once per week is okay.  Consider allergy injections for long term control if above medications do not help the symptoms - handout given.   Keep follow up with pulmonology regarding repeat CT chest next year.   Return in about 6 months (around 04/23/2023). Or sooner if needed.   Buffered Isotonic Saline Irrigations:  Goal: When you irrigate with the isotonic saline (salt water) it washes mucous and other debris from your nose that could be contributing to your nasal symptoms.   Recipe: Obtain 1 quart jar that is clean Fill with clean (bottled, boiled or distilled) water Add 1-2 heaping teaspoons of salt without iodine If the solution with 2 teaspoons of salt is too strong, adjust the amount down until better tolerated Add 1 teaspoon of [employer] baking soda (pure bicarbonate) Mix ingredients together and store at room temperature and discard after 1 week * Alternatively you can buy pre made salt packets for the NeilMed bottle or there          are other over the counter brands available  Instructions: Warm  cup of the solution in the microwave if desired but be careful not to overheat as this will burn the  inside of your nose Stand over a sink (or do it while you shower) and squirt the solution into one side of your nose aiming towards the back of your head Sometimes saying "coca cola" while irrigating can be helpful to prevent fluid from going down your throat  The solution will travel to the back of your nose and then come out the other side Perform this again on the other side Try to do this twice a day If you are using a nasal spray in addition to the irrigation, irrigate first and then use the topical nasal spray otherwise you will wash the nasal spray out of your nose   Reducing Pollen Exposure Pollen seasons: trees (spring), grass (summer) and ragweed/weeds (fall). Keep windows closed in your home and car to lower pollen exposure.  Install air conditioning in the bedroom and throughout the house if possible.  Avoid going out in dry windy days - especially early morning. Pollen counts are highest between 5 - 10 AM and on dry, hot and windy days.  Save outside activities for late afternoon or after a heavy rain, when pollen levels are lower.  Avoid mowing of grass if you have grass pollen allergy. Be aware that pollen can also be transported indoors on people and pets.  Dry your clothes in an automatic dryer rather than hanging them outside where they might collect pollen.  Rinse hair and eyes before bedtime.  Control of House Dust Mite Allergen Dust mite allergens are a common trigger of allergy and asthma symptoms. While they can be found throughout the house, these microscopic creatures thrive  in warm, humid environments such as bedding, upholstered furniture and carpeting. Because so much time is spent in the bedroom, it is essential to reduce mite levels there.  Encase pillows, mattresses, and box springs in special allergen-proof fabric covers or airtight, zippered plastic covers.  Bedding should be washed weekly in hot water (130 F) and dried in a hot dryer. Allergen-proof covers  are available for comforters and pillows that can't be regularly washed.  Wash the allergy-proof covers every few months. Minimize clutter in the bedroom. Keep pets out of the bedroom.  Keep humidity less than 50% by using a dehumidifier or air conditioning. You can buy a humidity measuring device called a hygrometer to monitor this.  If possible, replace carpets with hardwood, linoleum, or washable area rugs. If that's not possible, vacuum frequently with a vacuum that has a HEPA filter. Remove all upholstered furniture and non-washable window drapes from the bedroom. Remove all non-washable stuffed toys from the bedroom.  Wash stuffed toys weekly.  Mold Control Mold and fungi can grow on a variety of surfaces provided certain temperature and moisture conditions exist.  Outdoor molds grow on plants, decaying vegetation and soil. The major outdoor mold, Alternaria and Cladosporium, are found in very high numbers during hot and dry conditions. Generally, a late summer - fall peak is seen for common outdoor fungal spores. Rain will temporarily lower outdoor mold spore count, but counts rise rapidly when the rainy period ends. The most important indoor molds are Aspergillus and Penicillium. Dark, humid and poorly ventilated basements are ideal sites for mold growth. The next most common sites of mold growth are the bathroom and the kitchen. Outdoor (Seasonal) Mold Control Use air conditioning and keep windows closed. Avoid exposure to decaying vegetation. Avoid leaf raking. Avoid grain handling. Consider wearing a face mask if working in moldy areas.  Indoor (Perennial) Mold Control  Maintain humidity below 50%. Get rid of mold growth on hard surfaces with water, detergent and, if necessary, 5% bleach (do not mix with other cleaners). Then dry the area completely. If mold covers an area more than 10 square feet, consider hiring an indoor environmental professional. For clothing, washing with soap  and water is best. If moldy items cannot be cleaned and dried, throw them away. Remove sources e.g. contaminated carpets. Repair and seal leaking roofs or pipes. Using dehumidifiers in damp basements may be helpful, but empty the water and clean units regularly to prevent mildew from forming. All rooms, especially basements, bathrooms and kitchens, require ventilation and cleaning to deter mold and mildew growth. Avoid carpeting on concrete or damp floors, and storing items in damp areas.

## 2023-03-30 ENCOUNTER — Other Ambulatory Visit: Payer: Commercial Managed Care - HMO

## 2023-03-30 ENCOUNTER — Other Ambulatory Visit: Payer: Self-pay | Admitting: Acute Care

## 2023-03-30 DIAGNOSIS — R918 Other nonspecific abnormal finding of lung field: Secondary | ICD-10-CM

## 2023-04-06 ENCOUNTER — Ambulatory Visit
Admission: RE | Admit: 2023-04-06 | Discharge: 2023-04-06 | Disposition: A | Discharge status: Home / Self Care | Source: Ambulatory Visit | Attending: Acute Care | Admitting: Acute Care

## 2023-04-06 DIAGNOSIS — R918 Other nonspecific abnormal finding of lung field: Secondary | ICD-10-CM

## 2023-04-22 NOTE — Progress Notes (Unsigned)
 Follow Up Note  RE: Charles Skinner MRN: [id number] DOB: 19-Jul-1970 Date of Office Visit: 04/23/2023  Referring provider: Farris Has, MD Primary care provider: Farris Has, MD  Chief Complaint: No chief complaint on file.  History of Present Illness: I had the pleasure of seeing Charles Skinner for a follow up visit at the [facility] on 04/22/2023. He is a 53 y.o. male, who is being followed for allergic rhinitis. His previous allergy office visit was on 10/23/2022 with Dr. Selena Batten. Today is a regular follow up visit.  Discussed the use of AI scribe software for clinical note transcription with the patient, who gave verbal consent to proceed.  History of Present Illness            ***  Assessment and Plan: Kinsler is a 53 y.o. male with: Seasonal allergic rhinitis due to pollen Allergic rhinitis due to dust mite Allergic rhinitis due to mold Past history - Reports of intermittent sinus drainage, stuffy nose, and sneezing, particularly in spring and late summer/fall. Some relief with Claritin. Today's skin testing positive to grass, weed, mold, dust mites. Start environmental control measures as below. Recommend wearing masks when working with fine dust particles.  Use over the counter antihistamines such as Zyrtec (cetirizine), Claritin (loratadine), Allegra (fexofenadine), or Xyzal (levocetirizine) daily as needed. May take twice a day during allergy flares. May switch antihistamines every few months. Use Flonase (fluticasone) nasal spray 1-2 sprays per nostril once a day as needed for nasal congestion.  Nasal saline spray (i.e., Simply Saline) or nasal saline lavage (i.e., NeilMed) is recommended as needed and prior to medicated nasal sprays. Use oxymetazoline nasal spray sparingly as needed for severe nasal congestion. Once per week is okay.  Consider allergy injections for long term control if above medications do not help the symptoms - handout given.    Keep  follow up with pulmonology regarding repeat CT chest next year.  Assessment and Plan              No follow-ups on file.  No orders of the defined types were placed in this encounter.  Lab Orders  No laboratory test(s) ordered today    Diagnostics: Spirometry:  Tracings reviewed. His effort: {Blank single:19197::"Good reproducible efforts.","It was hard to get consistent efforts and there is a question as to whether this reflects a maximal maneuver.","Poor effort, data can not be interpreted."} FVC: ***L FEV1: ***L, ***% predicted FEV1/FVC ratio: ***% Interpretation: {Blank single:19197::"Spirometry consistent with mild obstructive disease","Spirometry consistent with moderate obstructive disease","Spirometry consistent with severe obstructive disease","Spirometry consistent with possible restrictive disease","Spirometry consistent with mixed obstructive and restrictive disease","Spirometry uninterpretable due to technique","Spirometry consistent with normal pattern","No overt abnormalities noted given today's efforts"}.  Please see scanned spirometry results for details.  Skin Testing: {Blank single:19197::"Select foods","Environmental allergy panel","Environmental allergy panel and select foods","Food allergy panel","None","Deferred due to recent antihistamines use"}. *** Results discussed with patient/family.   Medication List:  Current Outpatient Medications  Medication Sig Dispense Refill   Coenzyme Q10 (CO Q 10 PO) Take 1 capsule by mouth daily.     Cyanocobalamin (VITAMIN B 12 PO) Take 1 capsule by mouth daily in the afternoon.     fluticasone (FLONASE) 50 MCG/ACT nasal spray Place 1-2 sprays into both nostrils daily as needed (nasal congestion). 16 g 5   rosuvastatin (CRESTOR) 10 MG tablet Take 1 tablet (10 mg total) by mouth daily. 90 tablet 3   tadalafil (CIALIS) 5 MG tablet Take 1 tablet by  mouth daily.     Vitamin D, Ergocalciferol, (DRISDOL) 1.25 MG (50000 UT) CAPS  capsule Take 5,000 Units by mouth daily.     No current facility-administered medications for this visit.   Allergies: No Known Allergies I reviewed his past medical history, social history, family history, and environmental history and no significant changes have been reported from his previous visit.  Review of Systems  Constitutional:  Negative for appetite change, chills, fever and unexpected weight change.  HENT:  Positive for congestion, postnasal drip and rhinorrhea.   Eyes:  Negative for itching.  Respiratory:  Negative for cough, chest tightness, shortness of breath and wheezing.   Cardiovascular:  Negative for chest pain.  Gastrointestinal:  Negative for abdominal pain.  Genitourinary:  Negative for difficulty urinating.  Skin:  Negative for rash.  Allergic/Immunologic: Positive for environmental allergies.  Neurological:  Negative for headaches.    Objective: There were no vitals taken for this visit. There is no height or weight on file to calculate BMI. Physical Exam Vitals and nursing note reviewed.  Constitutional:      Appearance: Normal appearance. He is well-developed.  HENT:     Head: Normocephalic and atraumatic.     Right Ear: Tympanic membrane and external ear normal.     Left Ear: Tympanic membrane and external ear normal.     Nose: Nose normal.     Mouth/Throat:     Mouth: Mucous membranes are moist.     Pharynx: Oropharynx is clear.  Eyes:     Conjunctiva/sclera: Conjunctivae normal.  Cardiovascular:     Rate and Rhythm: Normal rate and regular rhythm.     Heart sounds: Normal heart sounds. No murmur heard.    No friction rub. No gallop.  Pulmonary:     Effort: Pulmonary effort is normal.     Breath sounds: Normal breath sounds. No wheezing, rhonchi or rales.  Musculoskeletal:     Cervical back: Neck supple.  Skin:    General: Skin is warm.     Findings: No rash.  Neurological:     Mental Status: He is alert and oriented to person, place,  and time.  Psychiatric:        Behavior: Behavior normal.    Previous notes and tests were reviewed. The plan was reviewed with the patient/family, and all questions/concerned were addressed.  It was my pleasure to see Emanuell today and participate in his care. Please feel free to contact me with any questions or concerns.  Sincerely,  Wyline Mood, DO Allergy & Immunology  [facility] office: [phone] [facility] office: [phone]

## 2023-04-23 ENCOUNTER — Encounter: Payer: Self-pay | Admitting: Allergy

## 2023-04-23 ENCOUNTER — Other Ambulatory Visit: Payer: Self-pay

## 2023-04-23 ENCOUNTER — Ambulatory Visit: Payer: Commercial Managed Care - HMO | Admitting: Allergy

## 2023-04-23 VITALS — BP 92/68 | HR 85 | Temp 97.6°F | Resp 16 | Ht 71.5 in | Wt 197.5 lb

## 2023-04-23 DIAGNOSIS — B999 Unspecified infectious disease: Secondary | ICD-10-CM

## 2023-04-23 DIAGNOSIS — R031 Nonspecific low blood-pressure reading: Secondary | ICD-10-CM

## 2023-04-23 DIAGNOSIS — J3089 Other allergic rhinitis: Secondary | ICD-10-CM

## 2023-04-23 DIAGNOSIS — J301 Allergic rhinitis due to pollen: Secondary | ICD-10-CM

## 2023-04-23 MED ORDER — RYALTRIS 665-25 MCG/ACT NA SUSP: 1.0000 | Freq: Two times a day (BID) | NASAL | 5 refills | Status: AC

## 2023-04-23 NOTE — Patient Instructions (Addendum)
 Environmental allergies 2024 skin testing positive to grass, weed, mold, dust mites. Continue environmental control measures as below. Recommend wearing masks when working with fine dust particles.  Use over the counter antihistamines such as Zyrtec (cetirizine), Claritin (loratadine), Allegra (fexofenadine), or Xyzal (levocetirizine) daily as needed. May take twice a day during allergy flares. May switch antihistamines every few months. Start Ryaltris (olopatadine + mometasone nasal spray combination) 1-2 sprays per nostril twice a day. Sample given. This replaces your other nasal sprays. If this works well for you, then have pharmacy ship the medication to your home - prescription already sent in.  Nasal saline spray (i.e., Simply Saline) or nasal saline lavage (i.e., NeilMed) is recommended as needed and prior to medicated nasal sprays. Recommend allergy injections. Let us know when ready to start.  2 injections. Had a detailed discussion with patient/family that clinical history is suggestive of allergic rhinitis, and may benefit from allergy immunotherapy (AIT). Discussed in detail regarding the dosing, schedule, side effects (mild to moderate local allergic reaction and rarely systemic allergic reactions including anaphylaxis), and benefits (significant improvement in nasal symptoms, seasonal flares of asthma) of immunotherapy with the patient. There is significant time commitment involved with allergy shots, which includes weekly immunotherapy injections for first 9-12 months and then biweekly to monthly injections for 3-5 years.   Keep follow up with pulmonology regarding repeat CT chest.   Infections Keep track of infections and antibiotics use. If persistent will get bloodwork next to look at immune system.   Blood pressure  If you notice any dizziness/lightheadedness then please follow up with PCP. Vitals:   04/23/23 1609  BP: 94/68    Return in about 1 year (around 04/22/2024).  Or sooner if needed.   Reducing Pollen Exposure Pollen seasons: trees (spring), grass (summer) and ragweed/weeds (fall). Keep windows closed in your home and car to lower pollen exposure.  Install air conditioning in the bedroom and throughout the house if possible.  Avoid going out in dry windy days - especially early morning. Pollen counts are highest between 5 - 10 AM and on dry, hot and windy days.  Save outside activities for late afternoon or after a heavy rain, when pollen levels are lower.  Avoid mowing of grass if you have grass pollen allergy. Be aware that pollen can also be transported indoors on people and pets.  Dry your clothes in an automatic dryer rather than hanging them outside where they might collect pollen.  Rinse hair and eyes before bedtime.  Control of House Dust Mite Allergen Dust mite allergens are a common trigger of allergy and asthma symptoms. While they can be found throughout the house, these microscopic creatures thrive in warm, humid environments such as bedding, upholstered furniture and carpeting. Because so much time is spent in the bedroom, it is essential to reduce mite levels there.  Encase pillows, mattresses, and box springs in special allergen-proof fabric covers or airtight, zippered plastic covers.  Bedding should be washed weekly in hot water (130 F) and dried in a hot dryer. Allergen-proof covers are available for comforters and pillows that can't be regularly washed.  Wash the allergy-proof covers every few months. Minimize clutter in the bedroom. Keep pets out of the bedroom.  Keep humidity less than 50% by using a dehumidifier or air conditioning. You can buy a humidity measuring device called a hygrometer to monitor this.  If possible, replace carpets with hardwood, linoleum, or washable area rugs. If that's not possible, vacuum frequently with  a vacuum that has a HEPA filter. Remove all upholstered furniture and non-washable window drapes  from the bedroom. Remove all non-washable stuffed toys from the bedroom.  Wash stuffed toys weekly.  Mold Control Mold and fungi can grow on a variety of surfaces provided certain temperature and moisture conditions exist.  Outdoor molds grow on plants, decaying vegetation and soil. The major outdoor mold, Alternaria and Cladosporium, are found in very high numbers during hot and dry conditions. Generally, a late summer - fall peak is seen for common outdoor fungal spores. Rain will temporarily lower outdoor mold spore count, but counts rise rapidly when the rainy period ends. The most important indoor molds are Aspergillus and Penicillium. Dark, humid and poorly ventilated basements are ideal sites for mold growth. The next most common sites of mold growth are the bathroom and the kitchen. Outdoor (Seasonal) Mold Control Use air conditioning and keep windows closed. Avoid exposure to decaying vegetation. Avoid leaf raking. Avoid grain handling. Consider wearing a face mask if working in moldy areas.  Indoor (Perennial) Mold Control  Maintain humidity below 50%. Get rid of mold growth on hard surfaces with water, detergent and, if necessary, 5% bleach (do not mix with other cleaners). Then dry the area completely. If mold covers an area more than 10 square feet, consider hiring an indoor environmental professional. For clothing, washing with soap and water is best. If moldy items cannot be cleaned and dried, throw them away. Remove sources e.g. contaminated carpets. Repair and seal leaking roofs or pipes. Using dehumidifiers in damp basements may be helpful, but empty the water and clean units regularly to prevent mildew from forming. All rooms, especially basements, bathrooms and kitchens, require ventilation and cleaning to deter mold and mildew growth. Avoid carpeting on concrete or damp floors, and storing items in damp areas.

## 2023-07-22 ENCOUNTER — Ambulatory Visit: Attending: Cardiology | Admitting: Cardiology

## 2023-07-22 ENCOUNTER — Encounter: Payer: Self-pay | Admitting: Cardiology

## 2023-07-22 VITALS — BP 110/74 | HR 72 | Ht 71.5 in | Wt 201.0 lb

## 2023-07-22 DIAGNOSIS — I358 Other nonrheumatic aortic valve disorders: Secondary | ICD-10-CM | POA: Diagnosis not present

## 2023-07-22 DIAGNOSIS — E785 Hyperlipidemia, unspecified: Secondary | ICD-10-CM

## 2023-07-22 DIAGNOSIS — I77810 Thoracic aortic ectasia: Secondary | ICD-10-CM | POA: Diagnosis not present

## 2023-07-22 DIAGNOSIS — I341 Nonrheumatic mitral (valve) prolapse: Secondary | ICD-10-CM | POA: Diagnosis not present

## 2023-07-22 NOTE — Progress Notes (Signed)
 Cardiology Office Note:  .   Date:  07/24/2023  ID:  Charles Skinner, Charles Skinner 05-Jun-1970, MRN [id number] PCP: Kip Righter, MD  [facility] Providers Cardiologist:  Alm Clay, MD     Chief Complaint  Patient presents with   Follow-up    No major issues.   Cardiac Valve Problem    Mild MVP noted on echo; dilated thoracic aorta    Patient Profile: .     Charles Skinner is a otherwise healthy 53 y.o. 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  06/12/22:  CT Angiogram aorta -> Ascending thoracic aortic aneurysm again noted, measuring maximally 4.3 cm compared to 4.5 cm previously. No dissection.      Charles Skinner was last seen on June 23, 2022 for routine follow-up.  Doing very well.  Happy to hear about stable findings on CT scan.  Activity limited by back pain but otherwise asymptomatic.  Subjective  Discussed the use of AI scribe software for clinical note transcription with the patient, who gave verbal consent to proceed.  History of Present Illness History of Present Illness Charles Skinner is a 53 year old male with aortic valve calcification and mitral valve prolapse who presents for routine follow-up.  His last echocardiogram in 2022 showed an ejection fraction of 60-65% with no wall motion abnormalities, grade one diastolic dysfunction, mild holosystolic prolapse of the middle scallop of the posterior mitral valve leaflet, and aortic valve calcification. The aorta was measured at 43 millimeters. A CT scan done in April of this year showed stability at 4.3 cm.  He notes that his blood pressure was low during a visit to an allergist earlier this year, with a reading of 92/68 mmHg, but he denies any symptoms of dizziness or feeling woozy. Home blood pressure measurements have been around 110/70 mmHg.  No heart palpitations, orthopnea, paroxysmal nocturnal dyspnea,  syncope, chest pain, pressure, or tightness, and peripheral edema.  He consumes energy drinks, typically one small can per day. He has noticed increased sweating over the past couple of years, which he attributes to the heat, but he is unsure if it is related to any medical condition.    Objective   Meds: Crestor  10 mg daily, as needed Cialis 5 mg daily; Ryaltris  665-25 mg nasal spray nightly  Studies Reviewed: SABRA   EKG Interpretation Date/Time:  Wednesday July 22 2023 12:19:46 EDT Ventricular Rate:  73 PR Interval:  186 QRS Duration:  92 QT Interval:  388 QTC Calculation: 427 R Axis:   59  Text Interpretation: Normal sinus rhythm Normal ECG When compared with ECG of 07-Mar-2012 05:14, No significant change was found Confirmed by Clay Alm ([id number]) on 07/22/2023 12:51:27 PM    Results  LABS (11/27/2022): TC 151, TG 103, HDL 39, LDL 94.  Hgb 15.0.  RADIOLOGY Chest CT: Stable fusiform dilated aorta measuring 4.3 cm; interval development of a few clustered groundglass nodular opacities in the central RUL measuring up to 7 mm.  Likely infectious versus inflammatory.  Similar findings of a few clustered groundglass nodule opacities in the posterior RUL.  (04/2023)  DIAGNOSTIC Echocardiogram: EF 60-65%, no wall motion abnormalities, grade 1 diastolic dysfunction, mild mitral valve prolapse, aortic valve calcification, aorta 43 mm (2022)  Risk Assessment/Calculations:           Physical Exam:   VS:  BP 110/74   Pulse 72   Ht 5' 11.5 (1.816 m)   Wt 201 lb (91.2 kg)   SpO2 98%   BMI 27.64 kg/m    Wt Readings from Last 3 Encounters:  07/22/23 201 lb (91.2 kg)  04/23/23 197 lb 8 oz (89.6 kg)  10/09/22 201 lb (91.2 kg)    GEN: Well nourished, well groomed in no acute distress; healthy-appearing.  Normal mood and affect. NECK: No JVD; No carotid bruits CARDIAC: Normal S1, S2; RRR soft 1/6 SEM at RUSB but unable to assess ; clipped.  Otherwise no rubs, gallops RESPIRATORY:   Clear to auscultation without rales, wheezing or rhonchi ; nonlabored, good air movement. ABDOMEN: Soft, non-tender, non-distended EXTREMITIES:  No edema; No deformity      ASSESSMENT AND PLAN: .    Problem List Items Addressed This Visit       Cardiology Problems   Dilatation of thoracic aorta (HCC) - Primary (Chronic)   Aortic ectasia 43 mm, stable by recent CT scan, no immediate intervention required. - Monitor with imaging studies as needed, particularly if abnormalities are noted in chest CT from pulmonary evaluations.      Relevant Orders   EKG 12-Lead (Completed)   ECHOCARDIOGRAM COMPLETE   Dyslipidemia, goal LDL below 100 (Chronic)   Most recent LDL was 94.  Controlled.  Continue 10 mg Crestor  for now.      Mild mitral valve prolapse (Chronic)   Mild, asymptomatic, well-managed. - Order echocardiogram to assess stability. - Advise moderation in caffeine intake, particularly energy drinks. - Instruct to monitor for symptoms such as palpitations or dyspnea. - Ensure adequate hydration, especially with caffeine consumption.      Relevant Orders   ECHOCARDIOGRAM COMPLETE     Other   Systolic murmur of aorta (Chronic)   Aortic valve calcification noted on echo Present, well-managed, no significant changes. - Continue to follow-up with echocardiogram evaluation of mitral prolapse            Follow-Up: Return in about 1 year (around 07/21/2024).     Signed, Alm MICAEL Clay, MD, MS Alm Clay, M.D., M.S. Interventional Chartered certified accountant  Pager # [phone]

## 2023-07-22 NOTE — Patient Instructions (Signed)
 Medication Instructions:  No changes  *If you need a refill on your cardiac medications before your next appointment, please call your pharmacy*   Lab Work: Not needed    Testing/Procedures:  Your physician has requested that you have an echocardiogram. Echocardiography is a painless test that uses sound waves to create images of your heart. It provides your doctor with information about the size and shape of your heart and how well your heart's chambers and valves are working. This procedure takes approximately one hour. There are no restrictions for this procedure. Please do NOT wear cologne, perfume, aftershave, or lotions (deodorant is allowed). Please arrive 15 minutes prior to your appointment time.  Please note: We ask at that you not bring children with you during ultrasound (echo/ vascular) testing. Due to room size and safety concerns, children are not allowed in the ultrasound rooms during exams. Our front office staff cannot provide observation of children in our lobby area while testing is being conducted. An adult accompanying a patient to their appointment will only be allowed in the ultrasound room at the discretion of the ultrasound technician under special circumstances. We apologize for any inconvenience.   Follow-Up: At [facility], you and your health needs are our priority.  As part of our continuing mission to provide you with exceptional heart care, we have created designated Provider Care Teams.  These Care Teams include your primary Cardiologist (physician) and Advanced Practice Providers (APPs -  Physician Assistants and Nurse Practitioners) who all work together to provide you with the care you need, when you need it.     Your next appointment:   12 month(s)  The format for your next appointment:   In Person  Provider:   Alm Clay, MD or Aline Door, PA-C, Damien Braver, NP, or Katlyn West, NP

## 2023-07-24 ENCOUNTER — Encounter: Payer: Self-pay | Admitting: Cardiology

## 2023-07-24 NOTE — Assessment & Plan Note (Signed)
 Mild, asymptomatic, well-managed. - Order echocardiogram to assess stability. - Advise moderation in caffeine intake, particularly energy drinks. - Instruct to monitor for symptoms such as palpitations or dyspnea. - Ensure adequate hydration, especially with caffeine consumption.

## 2023-07-24 NOTE — Assessment & Plan Note (Signed)
 Aortic ectasia 43 mm, stable by recent CT scan, no immediate intervention required. - Monitor with imaging studies as needed, particularly if abnormalities are noted in chest CT from pulmonary evaluations.

## 2023-07-24 NOTE — Assessment & Plan Note (Signed)
 Most recent LDL was 94.  Controlled.  Continue 10 mg Crestor  for now.

## 2023-07-24 NOTE — Assessment & Plan Note (Signed)
 Aortic valve calcification noted on echo Present, well-managed, no significant changes. - Continue to follow-up with echocardiogram evaluation of mitral prolapse

## 2023-07-27 ENCOUNTER — Ambulatory Visit (HOSPITAL_COMMUNITY)
Admission: RE | Admit: 2023-07-27 | Discharge: 2023-07-27 | Disposition: A | Discharge status: Home / Self Care | Source: Ambulatory Visit | Attending: Cardiology | Admitting: Cardiology

## 2023-07-27 DIAGNOSIS — I341 Nonrheumatic mitral (valve) prolapse: Secondary | ICD-10-CM | POA: Insufficient documentation

## 2023-07-27 DIAGNOSIS — I77810 Thoracic aortic ectasia: Secondary | ICD-10-CM | POA: Insufficient documentation

## 2023-07-27 LAB — ECHOCARDIOGRAM COMPLETE
Area-P 1/2: 4.02 cm2
S' Lateral: 2.9 cm

## 2023-07-30 ENCOUNTER — Ambulatory Visit: Payer: Self-pay | Admitting: Cardiology

## 2024-04-21 ENCOUNTER — Ambulatory Visit: Admitting: Allergy
